# Patient Record
Sex: Male | Born: 1965 | Race: Black or African American | Hispanic: No | Marital: Married | Smoking: Never smoker
Health system: Southern US, Community
[De-identification: ages and names within clinical notes are randomized; demographics above are authoritative.]

## PROBLEM LIST (undated history)

## (undated) DIAGNOSIS — N400 Enlarged prostate without lower urinary tract symptoms: Secondary | ICD-10-CM

## (undated) DIAGNOSIS — I10 Essential (primary) hypertension: Secondary | ICD-10-CM

## (undated) HISTORY — PX: LIPOMA EXCISION: SHX5283

---

## 2018-05-09 ENCOUNTER — Ambulatory Visit (HOSPITAL_COMMUNITY)
Admission: EM | Admit: 2018-05-09 | Discharge: 2018-05-09 | Disposition: A | Discharge status: Home / Self Care | Payer: 59 | Attending: Family Medicine | Admitting: Family Medicine

## 2018-05-09 ENCOUNTER — Encounter (HOSPITAL_COMMUNITY): Payer: Self-pay | Admitting: Emergency Medicine

## 2018-05-09 DIAGNOSIS — Z20818 Contact with and (suspected) exposure to other bacterial communicable diseases: Secondary | ICD-10-CM | POA: Insufficient documentation

## 2018-05-09 HISTORY — DX: Essential (primary) hypertension: I10

## 2018-05-09 HISTORY — DX: Benign prostatic hyperplasia without lower urinary tract symptoms: N40.0

## 2018-05-09 MED ORDER — AMOXICILLIN 875 MG PO TABS: 875.0000 mg | ORAL_TABLET | Freq: Two times a day (BID) | ORAL | 0 refills | Status: DC

## 2018-05-09 MED FILL — AMOXICILLIN 875 MG TABS: 875 | 10 days supply | Qty: 20 | Fill #0

## 2018-05-09 NOTE — Discharge Instructions (Addendum)
Take amoxicillin 2 x a day' May discontinue early if the culture is negative

## 2018-05-09 NOTE — ED Triage Notes (Signed)
Pt states his wife and son have sore throats, son tested positive for strep, and his wife states "his breath smells". So he would like to be checked for strep.

## 2018-05-09 NOTE — ED Provider Notes (Addendum)
Michael Duffy    CSN: [id number] Arrival date & time: 05/09/18  1137     History   Chief Complaint Chief Complaint  Patient presents with  . Sore Throat    HPI Michael Duffy is a 53 y.o. male.   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  His son had a positive throat strep test yesterday and today he does not feel well.  Wife said he had "bad breath" and he has some malaise and scratchy throat.  No fever.  No headache, cough, rhinorrhea.    Past Medical History:  Diagnosis Date  . BPH (benign prostatic hyperplasia)   . Hypertension     There are no active problems to display for this patient.   History reviewed. No pertinent surgical history.     Home Medications    Prior to Admission medications   Medication Sig Start Date End Date Taking? Authorizing Provider  aspirin 325 MG tablet Take 325 mg by mouth daily.   Yes [provider]  amoxicillin (AMOXIL) 875 MG tablet Take 1 tablet (875 mg total) by mouth 2 (two) times daily. 05/09/18   Raylene Everts, MD    Family History Family History  Family history unknown: Yes    Social History Social History   Tobacco Use  . Smoking status: Never Smoker  Substance Use Topics  . Alcohol use: Yes    Frequency: Never  . Drug use: Never     Allergies   Patient has no known allergies.   Review of Systems Review of Systems  Constitutional: Positive for fatigue. Negative for chills and fever.  HENT: Positive for sore throat. Negative for ear pain.   Eyes: Negative for pain and visual disturbance.  Respiratory: Negative for cough and shortness of breath.   Cardiovascular: Negative for chest pain and palpitations.  Gastrointestinal: Negative for abdominal pain and vomiting.  Genitourinary: Negative for dysuria and hematuria.  Musculoskeletal: Negative for arthralgias and back pain.  Skin: Negative for color change and rash.  Neurological: Negative for seizures and  syncope.  All other systems reviewed and are negative.    Physical Exam Triage Vital Signs ED Triage Vitals  Enc Vitals Group     BP 05/09/18 1158 (!) 158/94     Pulse Rate 05/09/18 1156 60     Resp 05/09/18 1156 18     Temp 05/09/18 1158 98.2 F (36.8 C)     Temp src --      SpO2 05/09/18 1156 100 %     Weight --      Height --      Head Circumference --      Peak Flow --      Pain Score 05/09/18 1157 0     Pain Loc --      Pain Edu? --      Excl. in [place]? --    No data found.  Updated Vital Signs BP (!) 158/94   Pulse 60   Temp 98.2 F (36.8 C)   Resp 18   SpO2 100%       Physical Exam Constitutional:      General: He is not in acute distress.    Appearance: He is well-developed and normal weight.  HENT:     Head: Normocephalic and atraumatic.     Right Ear: Tympanic membrane and ear canal normal.     Left Ear: Tympanic membrane and ear canal normal.  Nose: No congestion.     Mouth/Throat:     Pharynx: Posterior oropharyngeal erythema present.     Tonsils: No tonsillar exudate. Swelling: 0 on the right. 0 on the left.  Eyes:     Conjunctiva/sclera: Conjunctivae normal.     Pupils: Pupils are equal, round, and reactive to light.  Neck:     Musculoskeletal: Normal range of motion.  Cardiovascular:     Rate and Rhythm: Normal rate and regular rhythm.     Heart sounds: Normal heart sounds.  Pulmonary:     Effort: Pulmonary effort is normal. No respiratory distress.     Breath sounds: Normal breath sounds.  Abdominal:     General: There is no distension.     Palpations: Abdomen is soft.  Musculoskeletal: Normal range of motion.  Lymphadenopathy:     Cervical: No cervical adenopathy.  Skin:    General: Skin is warm and dry.  Neurological:     Mental Status: He is alert.  Psychiatric:        Mood and Affect: Mood normal.        Behavior: Behavior normal.      UC Treatments / Results  Labs (all labs ordered are listed, but only abnormal  results are displayed) Labs Reviewed  CULTURE, GROUP A STREP [facility])    EKG None  Radiology No results found.  Procedures Procedures (including critical care time)  Medications Ordered in UC Medications - No data to display  Initial Impression / Assessment and Plan / UC Course  I have reviewed the triage vital signs and the nursing notes.  Pertinent labs & imaging results that were available during my care of the patient were reviewed by me and considered in my medical decision making (see chart for details).  Clinical Course as of May 10 1599  Tue May 09, 2018  1502 POCT Rapid Strep [YN]    Clinical Course User Index [YN] Raylene Everts, MD    Even though rapid strep is negative I offered PCN to take until the culture is final. Final Clinical Impressions(s) / UC Diagnoses   Final diagnoses:  Strep throat exposure     Discharge Instructions     Take amoxicillin 2 x a day' May discontinue early if the culture is negative    ED Prescriptions    Medication Sig Dispense Auth. Provider   amoxicillin (AMOXIL) 875 MG tablet Take 1 tablet (875 mg total) by mouth 2 (two) times daily. 20 tablet Raylene Everts, MD     Controlled Substance Prescriptions [place] Controlled Substance Registry consulted? Not Applicable   Raylene Everts, MD 05/09/18 1234    Raylene Everts, MD 05/09/18 [phone]

## 2018-05-11 LAB — CULTURE, GROUP A STREP (THRC)

## 2018-10-02 DIAGNOSIS — K5730 Diverticulosis of large intestine without perforation or abscess without bleeding: Secondary | ICD-10-CM | POA: Diagnosis not present

## 2018-10-02 DIAGNOSIS — R972 Elevated prostate specific antigen [PSA]: Secondary | ICD-10-CM | POA: Diagnosis not present

## 2018-10-02 DIAGNOSIS — N4231 Prostatic intraepithelial neoplasia: Secondary | ICD-10-CM | POA: Diagnosis not present

## 2018-10-02 DIAGNOSIS — N401 Enlarged prostate with lower urinary tract symptoms: Secondary | ICD-10-CM | POA: Diagnosis not present

## 2018-10-02 DIAGNOSIS — N132 Hydronephrosis with renal and ureteral calculous obstruction: Secondary | ICD-10-CM | POA: Diagnosis not present

## 2018-10-02 DIAGNOSIS — R351 Nocturia: Secondary | ICD-10-CM | POA: Diagnosis not present

## 2018-10-02 DIAGNOSIS — N200 Calculus of kidney: Secondary | ICD-10-CM | POA: Diagnosis not present

## 2018-10-02 MED FILL — TAMSULOSIN HCL 0.4 MG CAP: 0.4 | 30 days supply | Qty: 30 | Fill #0

## 2018-10-02 MED FILL — OXYCODONE-ACETAMINOPHEN 5-3: 5-325 | 2 days supply | Qty: 15 | Fill #0

## 2018-10-06 DIAGNOSIS — D510 Vitamin B12 deficiency anemia due to intrinsic factor deficiency: Secondary | ICD-10-CM | POA: Diagnosis not present

## 2018-10-06 DIAGNOSIS — I110 Hypertensive heart disease with heart failure: Secondary | ICD-10-CM | POA: Diagnosis not present

## 2018-10-06 DIAGNOSIS — E559 Vitamin D deficiency, unspecified: Secondary | ICD-10-CM | POA: Diagnosis not present

## 2018-10-06 DIAGNOSIS — R5383 Other fatigue: Secondary | ICD-10-CM | POA: Diagnosis not present

## 2018-10-06 DIAGNOSIS — R972 Elevated prostate specific antigen [PSA]: Secondary | ICD-10-CM | POA: Diagnosis not present

## 2018-10-06 DIAGNOSIS — E7849 Other hyperlipidemia: Secondary | ICD-10-CM | POA: Diagnosis not present

## 2018-10-06 MED FILL — OLMESARTAN MEDOXOMIL 20 MG: 20 | 30 days supply | Qty: 30 | Fill #0

## 2018-10-11 DIAGNOSIS — N201 Calculus of ureter: Secondary | ICD-10-CM | POA: Diagnosis not present

## 2018-10-30 MED FILL — TAMSULOSIN HCL 0.4 MG CAP: 0.4 | 90 days supply | Qty: 90 | Fill #0

## 2018-10-30 MED FILL — OLMESARTAN MEDOXOMIL 20 MG: 20 | 30 days supply | Qty: 30 | Fill #1

## 2018-11-01 DIAGNOSIS — R350 Frequency of micturition: Secondary | ICD-10-CM | POA: Diagnosis not present

## 2018-11-01 DIAGNOSIS — N201 Calculus of ureter: Secondary | ICD-10-CM | POA: Diagnosis not present

## 2018-11-01 DIAGNOSIS — N401 Enlarged prostate with lower urinary tract symptoms: Secondary | ICD-10-CM | POA: Diagnosis not present

## 2018-12-03 MED FILL — OLMESARTAN MEDOXOMIL 20 MG: 20 | 30 days supply | Qty: 30 | Fill #2

## 2018-12-21 ENCOUNTER — Other Ambulatory Visit: Payer: Self-pay | Admitting: Occupational Medicine

## 2018-12-22 LAB — PSA: PSA: 6 ng/mL — ABNORMAL HIGH (ref <=4.0)

## 2018-12-22 LAB — COMPLETE METABOLIC PANEL WITH GFR
AG Ratio: 1.4 (calc) (ref 1.0–2.5)
ALT: 14 U/L (ref 9–46)
AST: 17 U/L (ref 10–35)
Albumin: 4.4 g/dL (ref 3.6–5.1)
Alkaline phosphatase (APISO): 61 U/L (ref 35–144)
BUN: 16 mg/dL (ref 7–25)
CO2: 30 mmol/L (ref 20–32)
Calcium: 9.9 mg/dL (ref 8.6–10.3)
Chloride: 103 mmol/L (ref 98–110)
Creat: 1.2 mg/dL (ref 0.70–1.33)
GFR, Est African American: 81 mL/min/{1.73_m2} (ref >=60)
GFR, Est Non African American: 70 mL/min/{1.73_m2} (ref >=60)
Globulin: 3.1 g/dL (calc) (ref 1.9–3.7)
Glucose, Bld: 91 mg/dL (ref 65–99)
Potassium: 4.3 mmol/L (ref 3.5–5.3)
Sodium: 140 mmol/L (ref 135–146)
Total Bilirubin: 0.7 mg/dL (ref 0.2–1.2)
Total Protein: 7.5 g/dL (ref 6.1–8.1)

## 2018-12-22 LAB — CBC WITH DIFFERENTIAL/PLATELET
Absolute Monocytes: 594 cells/uL (ref 200–950)
Basophils Absolute: 30 cells/uL (ref 0–200)
Basophils Relative: 0.5 %
Eosinophils Absolute: 42 cells/uL (ref 15–500)
Eosinophils Relative: 0.7 %
HCT: 41.1 % (ref 38.5–50.0)
Hemoglobin: 13.9 g/dL (ref 13.2–17.1)
Lymphs Abs: 2610 cells/uL (ref 850–3900)
MCH: 30.1 pg (ref 27.0–33.0)
MCHC: 33.8 g/dL (ref 32.0–36.0)
MCV: 89 fL (ref 80.0–100.0)
MPV: 9.8 fL (ref 7.5–12.5)
Monocytes Relative: 9.9 %
Neutro Abs: 2724 cells/uL (ref 1500–7800)
Neutrophils Relative %: 45.4 %
Platelets: 222 10*3/uL (ref 140–400)
RBC: 4.62 10*6/uL (ref 4.20–5.80)
RDW: 12.1 % (ref 11.0–15.0)
Total Lymphocyte: 43.5 %
WBC: 6 10*3/uL (ref 3.8–10.8)

## 2018-12-22 LAB — LIPID PANEL
Cholesterol: 175 mg/dL (ref <200)
HDL: 55 mg/dL (ref >=40)
LDL Cholesterol (Calc): 103 mg/dL (calc) — ABNORMAL HIGH
Non-HDL Cholesterol (Calc): 120 mg/dL (calc) (ref <130)
Total CHOL/HDL Ratio: 3.2 (calc) (ref <5.0)
Triglycerides: 80 mg/dL (ref <150)

## 2019-01-04 MED FILL — OLMESARTAN MEDOXOMIL 20 MG: 20 | 30 days supply | Qty: 30 | Fill #3

## 2019-01-25 MED FILL — TAMSULOSIN HCL 0.4 MG CAP: 0.4 | 90 days supply | Qty: 90 | Fill #1

## 2019-02-05 MED FILL — OLMESARTAN MEDOXOMIL 20 MG: 20 | 30 days supply | Qty: 30 | Fill #4

## 2019-03-07 MED FILL — OLMESARTAN MEDOXOMIL 20 MG: 20 | 30 days supply | Qty: 30 | Fill #5

## 2019-04-10 MED FILL — OLMESARTAN MEDOXOMIL 20 MG: 20 | 30 days supply | Qty: 30 | Fill #6

## 2019-05-10 MED FILL — TAMSULOSIN HCL 0.4 MG CAP: 0.4 | 90 days supply | Qty: 90 | Fill #2

## 2019-05-10 MED FILL — OLMESARTAN MEDOXOMIL 20 MG: 20 | 30 days supply | Qty: 30 | Fill #7

## 2019-06-15 MED FILL — OLMESARTAN MEDOXOMIL 20 MG: 20 | 30 days supply | Qty: 30 | Fill #8

## 2019-07-17 MED FILL — OLMESARTAN MEDOXOMIL 20 MG: 20 | 30 days supply | Qty: 30 | Fill #9

## 2019-08-22 MED FILL — OLMESARTAN MEDOXOMIL 20 MG: 20 | 30 days supply | Qty: 30 | Fill #10

## 2019-09-07 ENCOUNTER — Telehealth: Payer: Self-pay | Admitting: Internal Medicine

## 2019-09-07 NOTE — Telephone Encounter (Signed)
    Will you accept Mr Germond as a  new patient?

## 2019-09-20 MED FILL — OLMESARTAN MEDOXOMIL 20 MG: 20 | 30 days supply | Qty: 30 | Fill #0

## 2019-10-29 MED FILL — OLMESARTAN MEDOXOMIL 20 MG: 20 | 30 days supply | Qty: 30 | Fill #1

## 2019-11-07 MED FILL — TAMSULOSIN HCL 0.4 MG CAP: 0.4 | 90 days supply | Qty: 90 | Fill #0

## 2019-12-03 MED FILL — OLMESARTAN MEDOXOMIL 20 MG: 20 | 30 days supply | Qty: 30 | Fill #2

## 2019-12-10 ENCOUNTER — Encounter: Payer: Self-pay | Admitting: Family Medicine

## 2019-12-10 ENCOUNTER — Other Ambulatory Visit: Payer: Self-pay | Admitting: *Deleted

## 2019-12-10 ENCOUNTER — Other Ambulatory Visit: Payer: Self-pay

## 2019-12-10 ENCOUNTER — Ambulatory Visit (INDEPENDENT_AMBULATORY_CARE_PROVIDER_SITE_OTHER): Payer: 59 | Admitting: Family Medicine

## 2019-12-10 VITALS — BP 148/78 | HR 64 | Temp 97.4°F | Ht 69.0 in | Wt 234.0 lb

## 2019-12-10 DIAGNOSIS — Z23 Encounter for immunization: Secondary | ICD-10-CM | POA: Diagnosis not present

## 2019-12-10 DIAGNOSIS — N200 Calculus of kidney: Secondary | ICD-10-CM | POA: Diagnosis not present

## 2019-12-10 DIAGNOSIS — I10 Essential (primary) hypertension: Secondary | ICD-10-CM

## 2019-12-10 DIAGNOSIS — Z1211 Encounter for screening for malignant neoplasm of colon: Secondary | ICD-10-CM | POA: Diagnosis not present

## 2019-12-10 DIAGNOSIS — Z13220 Encounter for screening for lipoid disorders: Secondary | ICD-10-CM

## 2019-12-10 DIAGNOSIS — M545 Low back pain, unspecified: Secondary | ICD-10-CM | POA: Insufficient documentation

## 2019-12-10 DIAGNOSIS — Z125 Encounter for screening for malignant neoplasm of prostate: Secondary | ICD-10-CM | POA: Diagnosis not present

## 2019-12-10 DIAGNOSIS — Z131 Encounter for screening for diabetes mellitus: Secondary | ICD-10-CM

## 2019-12-10 DIAGNOSIS — L732 Hidradenitis suppurativa: Secondary | ICD-10-CM | POA: Diagnosis not present

## 2019-12-10 DIAGNOSIS — N400 Enlarged prostate without lower urinary tract symptoms: Secondary | ICD-10-CM

## 2019-12-10 MED ORDER — OLMESARTAN MEDOXOMIL-HCTZ 20-12.5 MG PO TABS: 1.0000 | ORAL_TABLET | Freq: Every day | ORAL | 3 refills | Status: DC

## 2019-12-10 MED ORDER — TAMSULOSIN HCL 0.4 MG PO CAPS: 0.4000 mg | ORAL_CAPSULE | Freq: Every day | ORAL | 3 refills | Status: DC

## 2019-12-10 MED FILL — OLMESARTAN-HCTZ 20-12.5 MG: 20-12.5 | 90 days supply | Qty: 90 | Fill #0

## 2019-12-10 MED FILL — TAMSULOSIN HCL 0.4 MG CAP: 0.4 | 90 days supply | Qty: 90 | Fill #0

## 2019-12-10 NOTE — Assessment & Plan Note (Signed)
Stable.  Refill Flomax 0.4 mg daily.  Check PSA.

## 2019-12-10 NOTE — Assessment & Plan Note (Signed)
Above goal.  Per patient not well controlled.  We will switch olmesartan monotherapy to olmesartan-HCTZ 20-12.5 dual therapy.  Check CBC, CMET, TSH today.  Continue home monitoring goal 140/90 or lower.

## 2019-12-10 NOTE — Assessment & Plan Note (Signed)
No recent recurrences.  Given elevated blood pressure readings today we will start HCTZ 12.5 mg daily which should also help reduce incidence of kidney stones-patient has had calcium oxalate stones in the past.

## 2019-12-10 NOTE — Patient Instructions (Signed)
It was very nice to see you today!  We will change you blood pressure medication to olmesartan-HCTZ.  Please keep an eye on your blood pressure and let me know if persistently 140/90 or higher.  We will give you your flu vaccine today.  We will check blood work today.  Please try using Hibiclens for the hidradenitis.  Let me know if you would like to try Clindagel.  I will place a referral for you to get your colonoscopy.  I will see you back in year for your an annual physical with blood work.  Please come back to see me sooner if needed.  Take care, Dr Jerline Pain  Please try these tips to maintain a healthy lifestyle:   Eat at least 3 REAL meals and 1-2 snacks per day.  Aim for no more than 5 hours between eating.  If you eat breakfast, please do so within one hour of getting up.    Each meal should contain half fruits/vegetables, one quarter protein, and one quarter carbs (no bigger than a computer mouse)   Cut down on sweet beverages. This includes juice, soda, and sweet tea.     Drink at least 1 glass of water with each meal and aim for at least 8 glasses per day   Exercise at least 150 minutes every week.

## 2019-12-10 NOTE — Assessment & Plan Note (Signed)
Recommended over-the-counter Hibiclens wash twice weekly.  Consider Clindagel if not improving.  Patient deferred for today.

## 2019-12-10 NOTE — Progress Notes (Signed)
Michael Duffy is a 54 y.o. male who presents today for an office visit.  Assessment/Plan:  Chronic Problems Addressed Today: Hidradenitis Recommended over-the-counter Hibiclens wash twice weekly.  Consider Clindagel if not improving.  Patient deferred for today.  BPH (benign prostatic hyperplasia) Stable.  Refill Flomax 0.4 mg daily.  Check PSA.  Kidney stones No recent recurrences.  Given elevated blood pressure readings today we will start HCTZ 12.5 mg daily which should also help reduce incidence of kidney stones-patient has had calcium oxalate stones in the past.   Essential hypertension Above goal.  Per patient not well controlled.  We will switch olmesartan monotherapy to olmesartan-HCTZ 20-12.5 dual therapy.  Check CBC, CMET, TSH today.  Continue home monitoring goal 140/90 or lower.  Preventative Healthcare Flu vaccine given today.  Will place referral for colonoscopy.  Check CBC, CMET, TSH, lipid panel, PSA, A1c.    Subjective:  HPI:  Patient is here to establish care.  Would like to talk about blood pressure, BPH, and hidradenitis.  Please see A/P for status of chronic conditions.  PMH:  The following were reviewed and entered/updated in epic: Past Medical History:  Diagnosis Date  . BPH (benign prostatic hyperplasia)   . Hypertension    Patient Active Problem List   Diagnosis Date Noted  . Essential hypertension 12/10/2019  . Kidney stones 12/10/2019  . BPH (benign prostatic hyperplasia) 12/10/2019  . Back pain, lumbosacral 12/10/2019  . Hidradenitis 12/10/2019   Past Surgical History:  Procedure Laterality Date  . LIPOMA EXCISION      Family History  Problem Relation Age of Onset  . Hypertension Mother   . Hypertension Sister     Medications- reviewed and updated Current Outpatient Medications  Medication Sig Dispense Refill  . tamsulosin (FLOMAX) 0.4 MG CAPS capsule Take 1 capsule (0.4 mg total) by mouth daily. 90 capsule 3  . aspirin 325  MG tablet Take 325 mg by mouth daily.    Marland Kitchen olmesartan-hydrochlorothiazide (BENICAR HCT) 20-12.5 MG tablet Take 1 tablet by mouth daily. 90 tablet 3   No current facility-administered medications for this visit.    Allergies-reviewed and updated No Known Allergies  Social History   Socioeconomic History  . Marital status: Married    Spouse name: Not on file  . Number of children: Not on file  . Years of education: Not on file  . Highest education level: Not on file  Occupational History  . Not on file  Tobacco Use  . Smoking status: Never Smoker  Substance and Sexual Activity  . Alcohol use: Yes    Comment: one or twice per year   . Drug use: Never  . Sexual activity: Yes  Other Topics Concern  . Not on file  Social History Narrative  . Not on file   Social Determinants of Health   Financial Resource Strain:   . Difficulty of Paying Living Expenses: Not on file  Food Insecurity:   . Worried About Charity fundraiser in the Last Year: Not on file  . Ran Out of Food in the Last Year: Not on file  Transportation Needs:   . Lack of Transportation (Medical): Not on file  . Lack of Transportation (Non-Medical): Not on file  Physical Activity:   . Days of Exercise per Week: Not on file  . Minutes of Exercise per Session: Not on file  Stress:   . Feeling of Stress : Not on file  Social Connections:   . Frequency  of Communication with Friends and Family: Not on file  . Frequency of Social Gatherings with Friends and Family: Not on file  . Attends Religious Services: Not on file  . Active Member of Clubs or Organizations: Not on file  . Attends Archivist Meetings: Not on file  . Marital Status: Not on file         Objective:  Physical Exam: BP (!) 148/78   Pulse 64   Temp (!) 97.4 F (36.3 C) (Temporal)   Ht 5\' 9"  (1.753 m)   Wt 234 lb (106.1 kg)   SpO2 98%   BMI 34.56 kg/m   Gen: No acute distress, resting comfortably CV: Regular rate and rhythm  with no murmurs appreciated Pulm: Normal work of breathing, clear to auscultation bilaterally with no crackles, wheezes, or rhonchi Skin: Scattered indurated nodules on neck line Neuro: Grossly normal, moves all extremities Psych: Normal affect and thought content      Michael Duffy M. Jerline Pain, MD 12/10/2019 10:19 AM

## 2019-12-11 ENCOUNTER — Encounter: Payer: Self-pay | Admitting: Family Medicine

## 2019-12-11 DIAGNOSIS — R739 Hyperglycemia, unspecified: Secondary | ICD-10-CM | POA: Insufficient documentation

## 2019-12-11 DIAGNOSIS — E785 Hyperlipidemia, unspecified: Secondary | ICD-10-CM | POA: Insufficient documentation

## 2019-12-11 LAB — LIPID PANEL
Cholesterol: 177 mg/dL (ref <200)
HDL: 49 mg/dL (ref >=40)
LDL Cholesterol (Calc): 111 mg/dL (calc) — ABNORMAL HIGH
Non-HDL Cholesterol (Calc): 128 mg/dL (calc) (ref <130)
Total CHOL/HDL Ratio: 3.6 (calc) (ref <5.0)
Triglycerides: 84 mg/dL (ref <150)

## 2019-12-11 LAB — COMPREHENSIVE METABOLIC PANEL
AG Ratio: 1.7 (calc) (ref 1.0–2.5)
ALT: 17 U/L (ref 9–46)
AST: 15 U/L (ref 10–35)
Albumin: 4.3 g/dL (ref 3.6–5.1)
Alkaline phosphatase (APISO): 74 U/L (ref 35–144)
BUN: 16 mg/dL (ref 7–25)
CO2: 28 mmol/L (ref 20–32)
Calcium: 9.3 mg/dL (ref 8.6–10.3)
Chloride: 103 mmol/L (ref 98–110)
Creat: 1.14 mg/dL (ref 0.70–1.33)
Globulin: 2.5 g/dL (calc) (ref 1.9–3.7)
Glucose, Bld: 100 mg/dL — ABNORMAL HIGH (ref 65–99)
Potassium: 4.2 mmol/L (ref 3.5–5.3)
Sodium: 139 mmol/L (ref 135–146)
Total Bilirubin: 0.7 mg/dL (ref 0.2–1.2)
Total Protein: 6.8 g/dL (ref 6.1–8.1)

## 2019-12-11 LAB — CBC
HCT: 41.5 % (ref 38.5–50.0)
Hemoglobin: 13.8 g/dL (ref 13.2–17.1)
MCH: 29.5 pg (ref 27.0–33.0)
MCHC: 33.3 g/dL (ref 32.0–36.0)
MCV: 88.7 fL (ref 80.0–100.0)
MPV: 10 fL (ref 7.5–12.5)
Platelets: 205 10*3/uL (ref 140–400)
RBC: 4.68 10*6/uL (ref 4.20–5.80)
RDW: 12.1 % (ref 11.0–15.0)
WBC: 5.6 10*3/uL (ref 3.8–10.8)

## 2019-12-11 LAB — TSH: TSH: 2.08 mIU/L (ref 0.40–4.50)

## 2019-12-11 LAB — PSA: PSA: 7.81 ng/mL — ABNORMAL HIGH (ref <=4.0)

## 2019-12-11 LAB — HEMOGLOBIN A1C
Hgb A1c MFr Bld: 5.7 % of total Hgb — ABNORMAL HIGH (ref <5.7)
Mean Plasma Glucose: 117 (calc)
eAG (mmol/L): 6.5 (calc)

## 2019-12-11 NOTE — Progress Notes (Signed)
Please inform patient of the following:  Blood sugar and cholesterol are borderline. Do not need to start meds but he should work on diet and exercise and we can recheck in a year.   PSA is elevated - I see where he has schedule appointment with urology.  I think this is a good idea.   Everything else is NORMAL.  Algis Greenhouse. Jerline Pain, MD 12/11/2019 9:57 AM

## 2019-12-27 ENCOUNTER — Encounter: Payer: Self-pay | Admitting: Gastroenterology

## 2020-01-23 DIAGNOSIS — R972 Elevated prostate specific antigen [PSA]: Secondary | ICD-10-CM | POA: Diagnosis not present

## 2020-01-23 DIAGNOSIS — N200 Calculus of kidney: Secondary | ICD-10-CM | POA: Diagnosis not present

## 2020-01-24 ENCOUNTER — Other Ambulatory Visit: Payer: Self-pay | Admitting: Urology

## 2020-01-24 DIAGNOSIS — R972 Elevated prostate specific antigen [PSA]: Secondary | ICD-10-CM

## 2020-02-16 ENCOUNTER — Other Ambulatory Visit: Payer: Self-pay

## 2020-02-16 ENCOUNTER — Ambulatory Visit
Admission: RE | Admit: 2020-02-16 | Discharge: 2020-02-16 | Disposition: A | Discharge status: Home / Self Care | Payer: 59 | Source: Ambulatory Visit | Attending: Urology | Admitting: Urology

## 2020-02-16 DIAGNOSIS — R972 Elevated prostate specific antigen [PSA]: Secondary | ICD-10-CM

## 2020-02-22 ENCOUNTER — Other Ambulatory Visit (HOSPITAL_COMMUNITY): Payer: Self-pay | Admitting: Urology

## 2020-02-22 MED FILL — DIAZEPAM 10 MG TABS: 10 | 1 days supply | Qty: 1 | Fill #0

## 2020-02-25 ENCOUNTER — Ambulatory Visit (AMBULATORY_SURGERY_CENTER): Payer: Self-pay | Admitting: *Deleted

## 2020-02-25 ENCOUNTER — Other Ambulatory Visit: Payer: Self-pay | Admitting: Gastroenterology

## 2020-02-25 ENCOUNTER — Other Ambulatory Visit: Payer: Self-pay | Admitting: Urology

## 2020-02-25 ENCOUNTER — Other Ambulatory Visit: Payer: Self-pay

## 2020-02-25 ENCOUNTER — Encounter: Payer: Self-pay | Admitting: Gastroenterology

## 2020-02-25 VITALS — Ht 69.0 in | Wt 233.0 lb

## 2020-02-25 DIAGNOSIS — Z1211 Encounter for screening for malignant neoplasm of colon: Secondary | ICD-10-CM

## 2020-02-25 MED ORDER — NA SULFATE-K SULFATE-MG SULF 17.5-3.13-1.6 GM/177ML PO SOLN: ORAL | 0 refills | Status: DC

## 2020-02-25 MED FILL — SUPREP BOWEL PREP KIT: 17.5-3.13-1 | 1 days supply | Qty: 354 | Fill #0

## 2020-02-25 NOTE — Progress Notes (Signed)
Patient is here in-person for PV. Patient denies any allergies to eggs or soy. Patient denies any problems with anesthesia/sedation. Patient denies any oxygen use at home. Patient denies taking any diet/weight loss medications or blood thinners. Patient is not being treated for MRSA or C-diff. Patient is aware of our care-partner policy and YTMMI-19 safety protocol. EMMI education assigned to the patient for the procedure, sent to [facility].   COVID-19 vaccines completed on 01/2020 booster, per patient.   Prep Prescription coupon given to the patient.

## 2020-02-26 ENCOUNTER — Other Ambulatory Visit: Payer: Self-pay | Admitting: Urology

## 2020-02-26 DIAGNOSIS — R972 Elevated prostate specific antigen [PSA]: Secondary | ICD-10-CM

## 2020-03-03 MED FILL — OLMESARTAN-HCTZ 20-12.5 MG: 20-12.5 | 90 days supply | Qty: 90 | Fill #1

## 2020-03-10 ENCOUNTER — Encounter: Payer: Self-pay | Admitting: Gastroenterology

## 2020-03-10 ENCOUNTER — Ambulatory Visit (AMBULATORY_SURGERY_CENTER): Payer: 59 | Admitting: Gastroenterology

## 2020-03-10 ENCOUNTER — Other Ambulatory Visit: Payer: Self-pay

## 2020-03-10 VITALS — BP 152/93 | HR 56 | Temp 97.8°F | Resp 18 | Ht 69.0 in | Wt 233.0 lb

## 2020-03-10 DIAGNOSIS — Z1211 Encounter for screening for malignant neoplasm of colon: Secondary | ICD-10-CM | POA: Diagnosis not present

## 2020-03-10 DIAGNOSIS — D122 Benign neoplasm of ascending colon: Secondary | ICD-10-CM | POA: Diagnosis not present

## 2020-03-10 HISTORY — PX: COLONOSCOPY: SHX174

## 2020-03-10 MED ORDER — SODIUM CHLORIDE 0.9 % IV SOLN: 500.0000 mL | Freq: Once | INTRAVENOUS | Status: DC

## 2020-03-10 NOTE — Op Note (Signed)
[facility] Patient Name: Michael Duffy Procedure Date: 03/10/2020 10:30 AM MRN: [id number] Endoscopist: Mallie Mussel L. Loletha Carrow , MD Age: 54 Referring MD:  Date of Birth: [birth year] Gender: Male Account #: [id number] Procedure:                Colonoscopy Indications:              Screening for colorectal malignant neoplasm, This                            is the patient's first colonoscopy Medicines:                Monitored Anesthesia Care Procedure:                Pre-Anesthesia Assessment:                           - Prior to the procedure, a History and Physical                            was performed, and patient medications and                            allergies were reviewed. The patient's tolerance of                            previous anesthesia was also reviewed. The risks                            and benefits of the procedure and the sedation                            options and risks were discussed with the patient.                            All questions were answered, and informed consent                            was obtained. Prior Anticoagulants: The patient has                            taken no previous anticoagulant or antiplatelet                            agents. ASA Grade Assessment: II - A patient with                            mild systemic disease. After reviewing the risks                            and benefits, the patient was deemed in                            satisfactory condition to undergo the procedure.  After obtaining informed consent, the colonoscope                            was passed under direct vision. Throughout the                            procedure, the patient's blood pressure, pulse, and                            oxygen saturations were monitored continuously. The                            Olympus CF-HQ190L [id number]) Colonoscope was                            introduced through the anus  and advanced to the the                            cecum, identified by appendiceal orifice and                            ileocecal valve. The colonoscopy was performed                            without difficulty. The patient tolerated the                            procedure well. The quality of the bowel                            preparation was excellent. The appendiceal orifice                            and rectum were photographed. The bowel preparation                            used was SUPREP. Scope In: 10:46:56 AM Scope Out: 11:01:05 AM Scope Withdrawal Time: 0 hours 11 minutes 57 seconds  Total Procedure Duration: 0 hours 14 minutes 9 seconds  Findings:                 The perianal and digital rectal examinations were                            normal.                           A diminutive polyp was found in the distal                            ascending colon. The polyp was flat. The polyp was                            removed with a cold biopsy forceps. Resection and  retrieval were complete.                           Multiple diverticula were found in the left colon                            and right colon.                           The exam was otherwise without abnormality on                            direct and retroflexion views. Complications:            No immediate complications. Estimated Blood Loss:     Estimated blood loss was minimal. Impression:               - One diminutive polyp in the distal ascending                            colon, removed with a cold biopsy forceps. Resected                            and retrieved.                           - Diverticulosis in the left colon and in the right                            colon.                           - The examination was otherwise normal on direct                            and retroflexion views. Recommendation:           - Patient has a contact number available  for                            emergencies. The signs and symptoms of potential                            delayed complications were discussed with the                            patient. Return to normal activities tomorrow.                            Written discharge instructions were provided to the                            patient.                           - Resume previous diet.                           -  Continue present medications.                           - Await pathology results.                           - Repeat colonoscopy is recommended for                            surveillance. The colonoscopy date will be                            determined after pathology results from today's                            exam become available for review. Vergene Marland L. Loletha Carrow, MD 03/10/2020 11:06:00 AM This report has been signed electronically.

## 2020-03-10 NOTE — Progress Notes (Signed)
Called to room to assist during endoscopic procedure.  Patient ID and intended procedure confirmed with present staff. Received instructions for my participation in the procedure from the performing physician.  

## 2020-03-10 NOTE — Progress Notes (Signed)
pt tolerated well. VSS. awake and to recovery. Report given to RN.  

## 2020-03-10 NOTE — Progress Notes (Signed)
VS-CW  Pt's states no medical or surgical changes since previsit or office visit.  

## 2020-03-10 NOTE — Patient Instructions (Signed)
Discharge instructions given. Handouts on polyps and diverticulosis. Resume previous medications. YOU HAD AN ENDOSCOPIC PROCEDURE TODAY AT THE [facility]:   Refer to the procedure report that was given to you for any specific questions about what was found during the examination.  If the procedure report does not answer your questions, please call your gastroenterologist to clarify.  If you requested that your care partner not be given the details of your procedure findings, then the procedure report has been included in a sealed envelope for you to review at your convenience later.  YOU SHOULD EXPECT: Some feelings of bloating in the abdomen. Passage of more gas than usual.  Walking can help get rid of the air that was put into your GI tract during the procedure and reduce the bloating. If you had a lower endoscopy (such as a colonoscopy or flexible sigmoidoscopy) you may notice spotting of blood in your stool or on the toilet paper. If you underwent a bowel prep for your procedure, you may not have a normal bowel movement for a few days.  Please Note:  You might notice some irritation and congestion in your nose or some drainage.  This is from the oxygen used during your procedure.  There is no need for concern and it should clear up in a day or so.  SYMPTOMS TO REPORT IMMEDIATELY:   Following lower endoscopy (colonoscopy or flexible sigmoidoscopy):  Excessive amounts of blood in the stool  Significant tenderness or worsening of abdominal pains  Swelling of the abdomen that is new, acute  Fever of 100F or higher   For urgent or emergent issues, a gastroenterologist can be reached at any hour by calling [phone]. Do not use MyChart messaging for urgent concerns.    DIET:  We do recommend a small meal at first, but then you may proceed to your regular diet.  Drink plenty of fluids but you should avoid alcoholic beverages for 24 hours.  ACTIVITY:  You should plan to take  it easy for the rest of today and you should NOT DRIVE or use heavy machinery until tomorrow (because of the sedation medicines used during the test).    FOLLOW UP: Our staff will call the number listed on your records 48-72 hours following your procedure to check on you and address any questions or concerns that you may have regarding the information given to you following your procedure. If we do not reach you, we will leave a message.  We will attempt to reach you two times.  During this call, we will ask if you have developed any symptoms of COVID 19. If you develop any symptoms (ie: fever, flu-like symptoms, shortness of breath, cough etc.) before then, please call [phone].  If you test positive for Covid 19 in the 2 weeks post procedure, please call and report this information to us.    If any biopsies were taken you will be contacted by phone or by letter within the next 1-3 weeks.  Please call us at [phone] if you have not heard about the biopsies in 3 weeks.    SIGNATURES/CONFIDENTIALITY: You and/or your care partner have signed paperwork which will be entered into your electronic medical record.  These signatures attest to the fact that that the information above on your After Visit Summary has been reviewed and is understood.  Full responsibility of the confidentiality of this discharge information lies with you and/or your care-partner. 

## 2020-03-13 ENCOUNTER — Telehealth: Payer: Self-pay

## 2020-03-13 NOTE — Telephone Encounter (Signed)
LVM

## 2020-03-19 MED FILL — TAMSULOSIN HCL 0.4 MG CAP: 0.4 | 90 days supply | Qty: 90 | Fill #1

## 2020-03-24 ENCOUNTER — Encounter: Payer: Self-pay | Admitting: Gastroenterology

## 2020-03-25 ENCOUNTER — Other Ambulatory Visit: Payer: Self-pay

## 2020-03-25 ENCOUNTER — Ambulatory Visit
Admission: RE | Admit: 2020-03-25 | Discharge: 2020-03-25 | Disposition: A | Discharge status: Home / Self Care | Payer: 59 | Source: Ambulatory Visit | Attending: Urology | Admitting: Urology

## 2020-03-25 ENCOUNTER — Other Ambulatory Visit: Payer: 59

## 2020-03-25 DIAGNOSIS — R972 Elevated prostate specific antigen [PSA]: Secondary | ICD-10-CM | POA: Diagnosis not present

## 2020-03-25 IMAGING — MR MR PROSTATE WO/W CM
12 series · 48 of 48 positions shown · IV contrast (multihance)
Comparison: None

CLINICAL DATA: 54-year-old male with elevated PSA 7.8. Prior
negative biopsies most recently [DATE].

EXAM:
MR PROSTATE WITHOUT AND WITH CONTRAST
TECHNIQUE: Multiplanar multisequence MRI images were obtained of the pelvis
centered about the prostate. Pre and post contrast images were
obtained.
CONTRAST:  20mL MULTIHANCE GADOBENATE DIMEGLUMINE 529 MG/ML IV SOLN

[Series 3: T2 · coronal · 3.0mm · 0.56mm/px · 1 of 27 slices shown (1 of 3)]
[im 1/27]
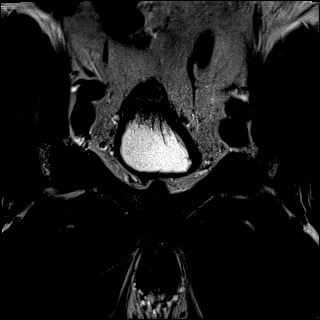

[Series 4: T1 · axial · 5.0mm · 1.25mm/px · 1 of 80 slices shown]
[im 1/80]
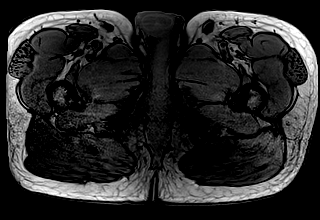

[Series 5: DWI · axial · 3.0mm · 1.75mm/px · z∈[-62,+1]mm · 2 of 66 slices shown (1 of 3)]
[im 1/66]
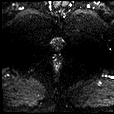
[im 66/66]
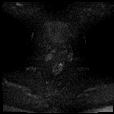

[Series 6: DWI · axial · 3.0mm · 1.75mm/px · 1 of 22 slices shown (2 of 3)]
[im 1/22]
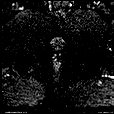

[Series 7: DWI · axial · 3.0mm · 1.75mm/px · 1 of 22 slices shown (3 of 3)]
[im 1/22]
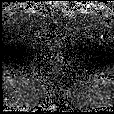

[Series 8: T2 · axial · 3.0mm · 0.56mm/px · 1 of 23 slices shown (2 of 3)]
[im 1/23]
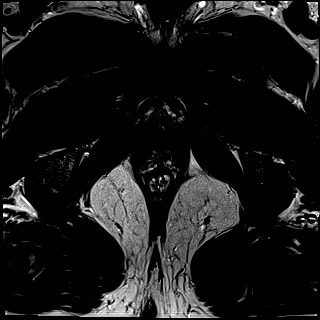

[Series 9: T2 · axial · 1.0mm · 1.04mm/px · z∈[-72,+7]mm · 2 of 80 slices shown (3 of 3)]
[im 1/80]
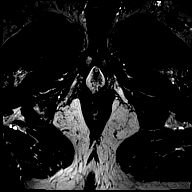
[im 80/80]
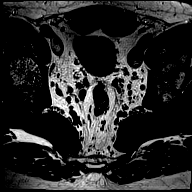

[Series 10: pre t1_twist_tra_dyn · axial · non-contrast · 3.5mm · 0.83mm/px · 1 of 20 slices shown]
[im 1/20]
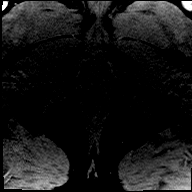

[Series 11: post t1_twist_tra_dyn-copy center · axial · non-contrast · 3.5mm · 0.83mm/px · z∈[-66,+0]mm · 17 of 600 slices shown]
[im 1/600]
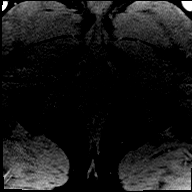
[im 38/600]
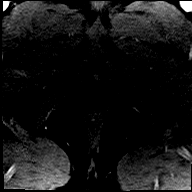
[im 75/600]
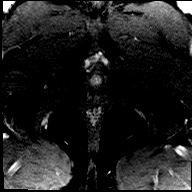
[im 113/600]
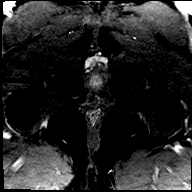
[im 150/600]
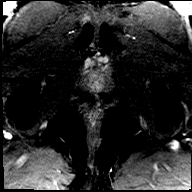
[im 188/600]
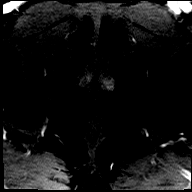
[im 225/600]
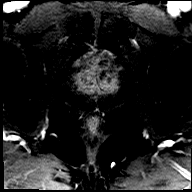
[im 263/600]
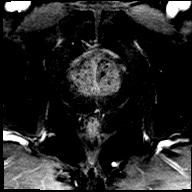
[im 300/600]
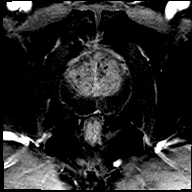
[im 337/600]
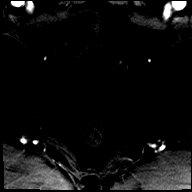
[im 375/600]
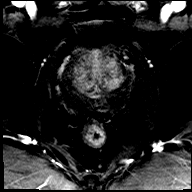
[im 412/600]
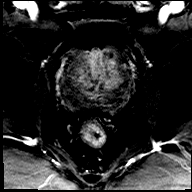
[im 450/600]
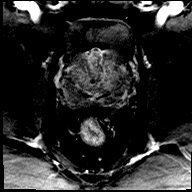
[im 487/600]
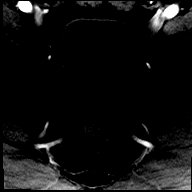
[im 525/600]
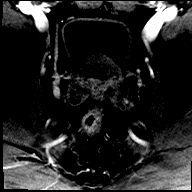
[im 562/600]
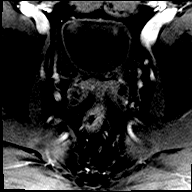
[im 600/600]
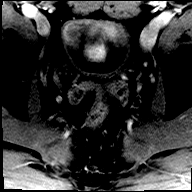

[Series 12: post t1_twist_tra_dyn-copy cent_sub · axial · 3.5mm · 0.83mm/px · z∈[-66,+0]mm · 17 of 580 slices shown]
[im 1/580]
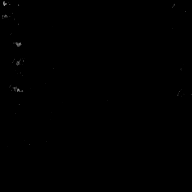
[im 37/580]
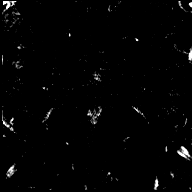
[im 73/580]
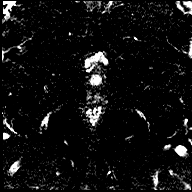
[im 109/580]
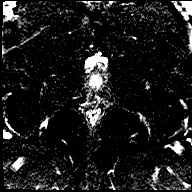
[im 145/580]
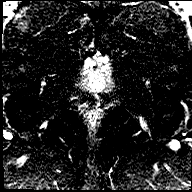
[im 181/580]
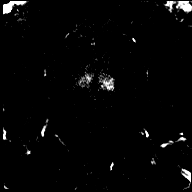
[im 218/580]
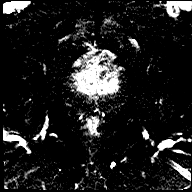
[im 254/580]
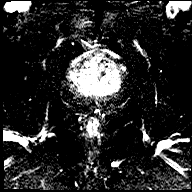
[im 290/580]
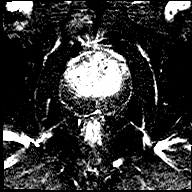
[im 326/580]
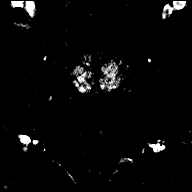
[im 362/580]
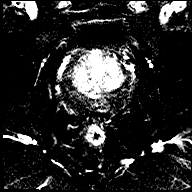
[im 399/580]
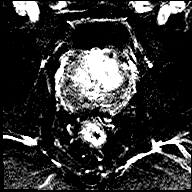
[im 435/580]
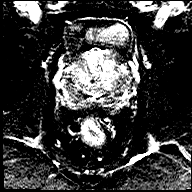
[im 471/580]
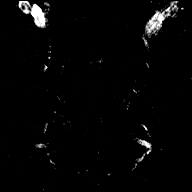
[im 507/580]
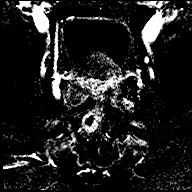
[im 543/580]
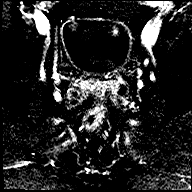
[im 580/580]
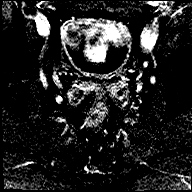

[Series 13: t1_vibe_dixon_tra_f · axial · 2.5mm · 0.91mm/px · z∈[-82,+115]mm · 2 of 80 slices shown]
[im 1/80]
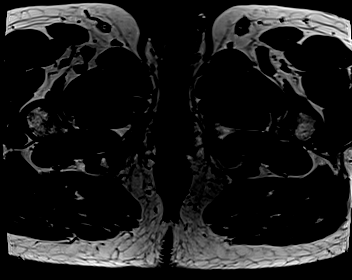
[im 80/80]
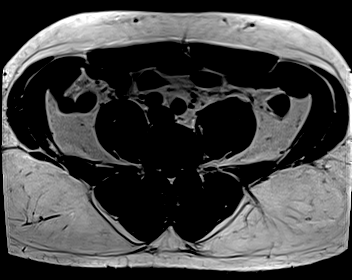

[Series 14: t1_vibe_dixon_tra_w · axial · 2.5mm · 0.91mm/px · z∈[-82,+115]mm · 2 of 80 slices shown]
[im 1/80]
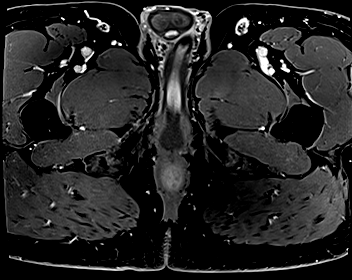
[im 80/80]
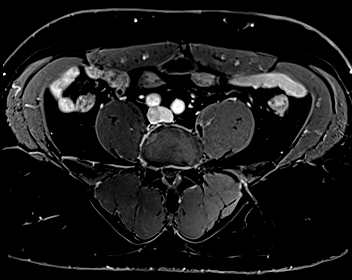

[48 of 48 positions shown; findings below may reference images not displayed]

FINDINGS: Prostate: No foci of restricted diffusion in the peripheral zone
(series 6). Normal high signal intensity within the peripheral zone
on T2 weighted imaging without focal lesion (image series 8).

The transitional zone is enlarged by well capsulated nodules. No
suspicious imaging findings.

No abnormal enhancement pattern.

Prostatic capsule intact.  Seminal vesicles normal.

Volume: 6.1 x 5.3 by 4.2 cm (volume = 71 cm^3)

Transcapsular spread:  Absent

Seminal vesicle involvement: Absent

Neurovascular bundle involvement: Absent

Pelvic adenopathy: Absent

Bone metastasis: Absent

Other findings: None
IMPRESSION: 1. No high-grade carcinoma in the peripheral zone.  PI-RADS: 1.
2. Enlarged nodular transitional zone most consistent benign
prostate hypertrophy. PI-RADS: 2

## 2020-03-25 MED ORDER — GADOBENATE DIMEGLUMINE 529 MG/ML IV SOLN
20.0000 mL | Freq: Once | INTRAVENOUS | Status: AC | PRN
  Administered 2020-03-25: 20 mL via INTRAVENOUS

## 2020-04-12 ENCOUNTER — Other Ambulatory Visit: Payer: 59

## 2020-06-09 MED FILL — OLMESARTAN-HCTZ 20-12.5 MG: 20-12.5 | 90 days supply | Qty: 90 | Fill #2

## 2020-06-18 ENCOUNTER — Other Ambulatory Visit (HOSPITAL_COMMUNITY): Payer: Self-pay | Admitting: Dermatology

## 2020-06-18 DIAGNOSIS — L700 Acne vulgaris: Secondary | ICD-10-CM | POA: Diagnosis not present

## 2020-06-18 MED FILL — CLINDAMYCIN PHOS-BENZOYL PE: 1-5 | 50 days supply | Qty: 25 | Fill #0

## 2020-06-18 MED FILL — DOXYCYCLINE HYCLATE 100 MG: 100 | 30 days supply | Qty: 60 | Fill #0

## 2020-07-17 ENCOUNTER — Other Ambulatory Visit (HOSPITAL_COMMUNITY): Payer: Self-pay

## 2020-07-17 MED FILL — Doxycycline Hyclate Cap 100 MG: ORAL | 30 days supply | Qty: 60 | Fill #0 | Status: AC

## 2020-07-17 MED FILL — Clindamycin Phosphate-Benzoyl Peroxide Gel 1-5%: CUTANEOUS | 30 days supply | Qty: 50 | Fill #0 | Status: CN

## 2020-07-18 ENCOUNTER — Other Ambulatory Visit (HOSPITAL_COMMUNITY): Payer: Self-pay

## 2020-07-18 MED FILL — Clindamycin Phosphate-Benzoyl Peroxide Gel 1-5%: CUTANEOUS | 30 days supply | Qty: 50 | Fill #0 | Status: AC

## 2020-07-28 ENCOUNTER — Other Ambulatory Visit (HOSPITAL_COMMUNITY): Payer: Self-pay

## 2020-08-16 MED FILL — Doxycycline Hyclate Cap 100 MG: ORAL | 30 days supply | Qty: 60 | Fill #1 | Status: AC

## 2020-08-19 ENCOUNTER — Other Ambulatory Visit (HOSPITAL_COMMUNITY): Payer: Self-pay

## 2020-08-19 MED FILL — Tamsulosin HCl Cap 0.4 MG: ORAL | 90 days supply | Qty: 90 | Fill #0 | Status: AC

## 2020-09-13 ENCOUNTER — Other Ambulatory Visit (HOSPITAL_COMMUNITY): Payer: Self-pay

## 2020-09-13 MED FILL — Olmesartan Medoxomil-Hydrochlorothiazide Tab 20-12.5 MG: ORAL | 90 days supply | Qty: 90 | Fill #0 | Status: AC

## 2020-09-18 DIAGNOSIS — L700 Acne vulgaris: Secondary | ICD-10-CM | POA: Diagnosis not present

## 2020-09-19 DIAGNOSIS — R972 Elevated prostate specific antigen [PSA]: Secondary | ICD-10-CM | POA: Diagnosis not present

## 2020-09-22 MED FILL — Doxycycline Hyclate Cap 100 MG: ORAL | 30 days supply | Qty: 60 | Fill #2 | Status: AC

## 2020-09-23 ENCOUNTER — Other Ambulatory Visit (HOSPITAL_COMMUNITY): Payer: Self-pay

## 2020-09-26 ENCOUNTER — Other Ambulatory Visit (HOSPITAL_COMMUNITY): Payer: Self-pay

## 2020-09-26 DIAGNOSIS — R351 Nocturia: Secondary | ICD-10-CM | POA: Diagnosis not present

## 2020-09-26 DIAGNOSIS — N401 Enlarged prostate with lower urinary tract symptoms: Secondary | ICD-10-CM | POA: Diagnosis not present

## 2020-09-26 DIAGNOSIS — R972 Elevated prostate specific antigen [PSA]: Secondary | ICD-10-CM | POA: Diagnosis not present

## 2020-09-26 DIAGNOSIS — Z87442 Personal history of urinary calculi: Secondary | ICD-10-CM | POA: Diagnosis not present

## 2020-09-26 MED ORDER — ALFUZOSIN HCL ER 10 MG PO TB24
ORAL_TABLET | ORAL | 3 refills | Status: DC
  Filled 2020-09-26: qty 90, 90d supply, fill #0
  Filled 2020-12-21: qty 90, 90d supply, fill #1

## 2020-10-22 ENCOUNTER — Other Ambulatory Visit (HOSPITAL_COMMUNITY): Payer: Self-pay

## 2020-10-22 MED FILL — Doxycycline Hyclate Cap 100 MG: ORAL | 30 days supply | Qty: 60 | Fill #3 | Status: AC

## 2020-10-23 ENCOUNTER — Other Ambulatory Visit (HOSPITAL_COMMUNITY): Payer: Self-pay

## 2020-11-21 ENCOUNTER — Other Ambulatory Visit (HOSPITAL_COMMUNITY): Payer: Self-pay

## 2020-11-21 MED FILL — Doxycycline Hyclate Cap 100 MG: ORAL | 30 days supply | Qty: 60 | Fill #4 | Status: AC

## 2020-11-28 DIAGNOSIS — N401 Enlarged prostate with lower urinary tract symptoms: Secondary | ICD-10-CM | POA: Diagnosis not present

## 2020-11-28 DIAGNOSIS — R351 Nocturia: Secondary | ICD-10-CM | POA: Diagnosis not present

## 2020-12-05 ENCOUNTER — Other Ambulatory Visit (HOSPITAL_COMMUNITY): Payer: Self-pay

## 2020-12-05 MED ORDER — LEVOFLOXACIN 750 MG PO TABS
ORAL_TABLET | ORAL | 0 refills | Status: DC
  Filled 2020-12-05: qty 1, 1d supply, fill #0

## 2020-12-09 ENCOUNTER — Other Ambulatory Visit (HOSPITAL_COMMUNITY): Payer: Self-pay

## 2020-12-09 MED ORDER — LEVOFLOXACIN 750 MG PO TABS
ORAL_TABLET | ORAL | 0 refills | Status: DC
  Filled 2020-12-09: qty 1, 1d supply, fill #0

## 2020-12-10 ENCOUNTER — Encounter: Payer: Self-pay | Admitting: Family Medicine

## 2020-12-10 ENCOUNTER — Ambulatory Visit (INDEPENDENT_AMBULATORY_CARE_PROVIDER_SITE_OTHER): Payer: 59 | Admitting: Family Medicine

## 2020-12-10 ENCOUNTER — Other Ambulatory Visit (HOSPITAL_COMMUNITY): Payer: Self-pay

## 2020-12-10 ENCOUNTER — Ambulatory Visit (INDEPENDENT_AMBULATORY_CARE_PROVIDER_SITE_OTHER): Payer: 59

## 2020-12-10 ENCOUNTER — Other Ambulatory Visit: Payer: Self-pay

## 2020-12-10 VITALS — BP 116/66 | HR 66 | Temp 98.1°F | Ht 69.0 in | Wt 229.6 lb

## 2020-12-10 DIAGNOSIS — N400 Enlarged prostate without lower urinary tract symptoms: Secondary | ICD-10-CM

## 2020-12-10 DIAGNOSIS — Z0001 Encounter for general adult medical examination with abnormal findings: Secondary | ICD-10-CM | POA: Diagnosis not present

## 2020-12-10 DIAGNOSIS — N200 Calculus of kidney: Secondary | ICD-10-CM

## 2020-12-10 DIAGNOSIS — Z6833 Body mass index (BMI) 33.0-33.9, adult: Secondary | ICD-10-CM | POA: Diagnosis not present

## 2020-12-10 DIAGNOSIS — R739 Hyperglycemia, unspecified: Secondary | ICD-10-CM | POA: Diagnosis not present

## 2020-12-10 DIAGNOSIS — Z23 Encounter for immunization: Secondary | ICD-10-CM | POA: Diagnosis not present

## 2020-12-10 DIAGNOSIS — M545 Low back pain, unspecified: Secondary | ICD-10-CM

## 2020-12-10 DIAGNOSIS — I10 Essential (primary) hypertension: Secondary | ICD-10-CM

## 2020-12-10 DIAGNOSIS — E785 Hyperlipidemia, unspecified: Secondary | ICD-10-CM | POA: Diagnosis not present

## 2020-12-10 LAB — CBC
HCT: 44.6 % (ref 39.0–52.0)
Hemoglobin: 14.9 g/dL (ref 13.0–17.0)
MCHC: 33.3 g/dL (ref 30.0–36.0)
MCV: 90.7 fl (ref 78.0–100.0)
Platelets: 199 10*3/uL (ref 150.0–400.0)
RBC: 4.91 Mil/uL (ref 4.22–5.81)
RDW: 13.3 % (ref 11.5–15.5)
WBC: 4.5 10*3/uL (ref 4.0–10.5)

## 2020-12-10 LAB — COMPREHENSIVE METABOLIC PANEL
ALT: 23 U/L (ref 0–53)
AST: 23 U/L (ref 0–37)
Albumin: 4.4 g/dL (ref 3.5–5.2)
Alkaline Phosphatase: 59 U/L (ref 39–117)
BUN: 24 mg/dL — ABNORMAL HIGH (ref 6–23)
CO2: 29 mEq/L (ref 19–32)
Calcium: 10 mg/dL (ref 8.4–10.5)
Chloride: 101 mEq/L (ref 96–112)
Creatinine, Ser: 1.39 mg/dL (ref 0.40–1.50)
GFR: 57.71 mL/min — ABNORMAL LOW (ref >60.00)
Glucose, Bld: 95 mg/dL (ref 70–99)
Potassium: 4.2 mEq/L (ref 3.5–5.1)
Sodium: 138 mEq/L (ref 135–145)
Total Bilirubin: 1.1 mg/dL (ref 0.2–1.2)
Total Protein: 7.3 g/dL (ref 6.0–8.3)

## 2020-12-10 LAB — LIPID PANEL
Cholesterol: 185 mg/dL (ref 0–200)
HDL: 57 mg/dL (ref >39.00)
LDL Cholesterol: 107 mg/dL — ABNORMAL HIGH (ref 0–99)
NonHDL: 128.09
Total CHOL/HDL Ratio: 3
Triglycerides: 104 mg/dL (ref 0.0–149.0)
VLDL: 20.8 mg/dL (ref 0.0–40.0)

## 2020-12-10 LAB — HEMOGLOBIN A1C: Hgb A1c MFr Bld: 5.6 % (ref 4.6–6.5)

## 2020-12-10 LAB — TESTOSTERONE: Testosterone: 361.11 ng/dL (ref 300.00–890.00)

## 2020-12-10 LAB — TSH: TSH: 1.91 u[IU]/mL (ref 0.35–5.50)

## 2020-12-10 IMAGING — DX DG LUMBAR SPINE COMPLETE 4+V
5 series · 5 of 5 positions shown · non-contrast
Comparison: None.

CLINICAL DATA: Low back pain.

EXAM:
LUMBAR SPINE - COMPLETE 4+ VIEW

[l-spine ap]
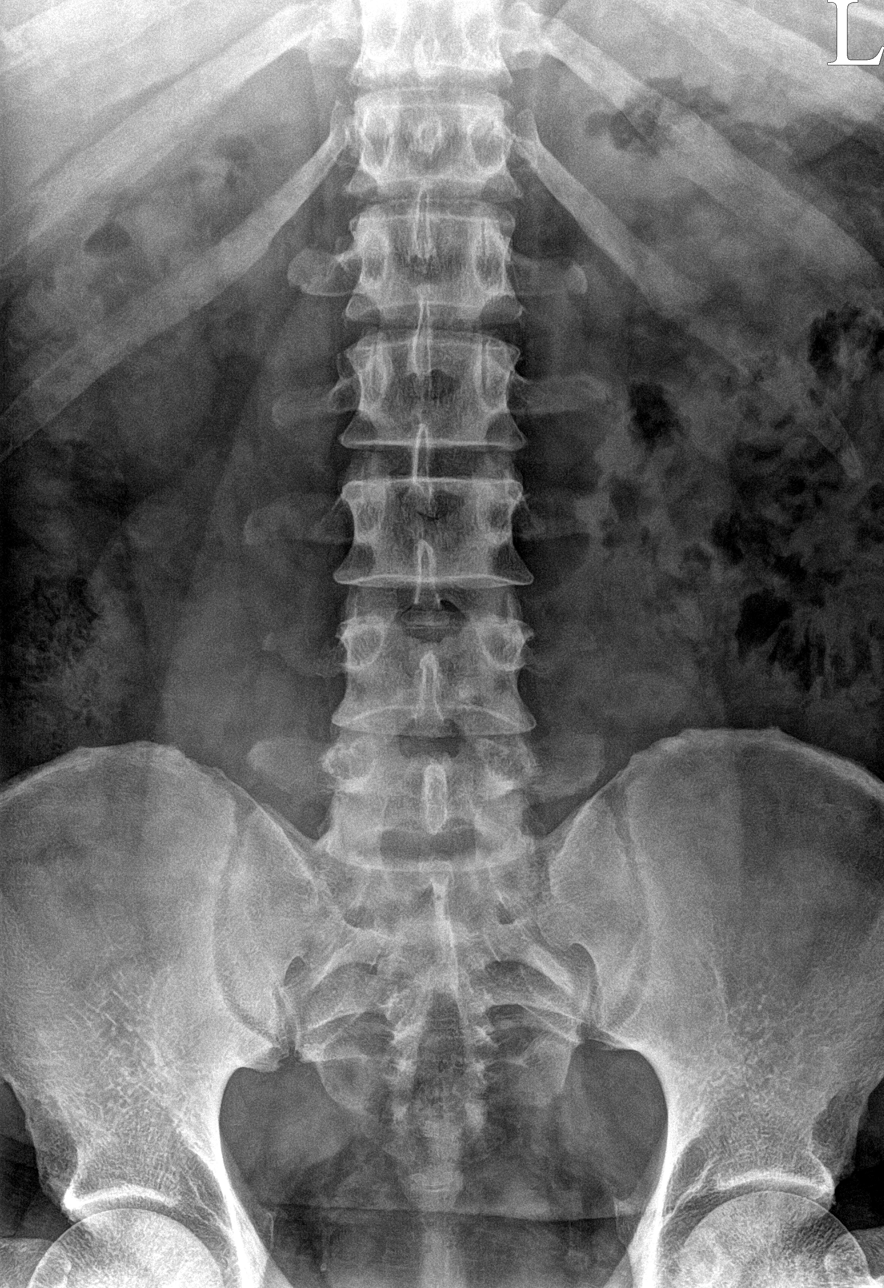

[l-spine obl (1 of 2)]
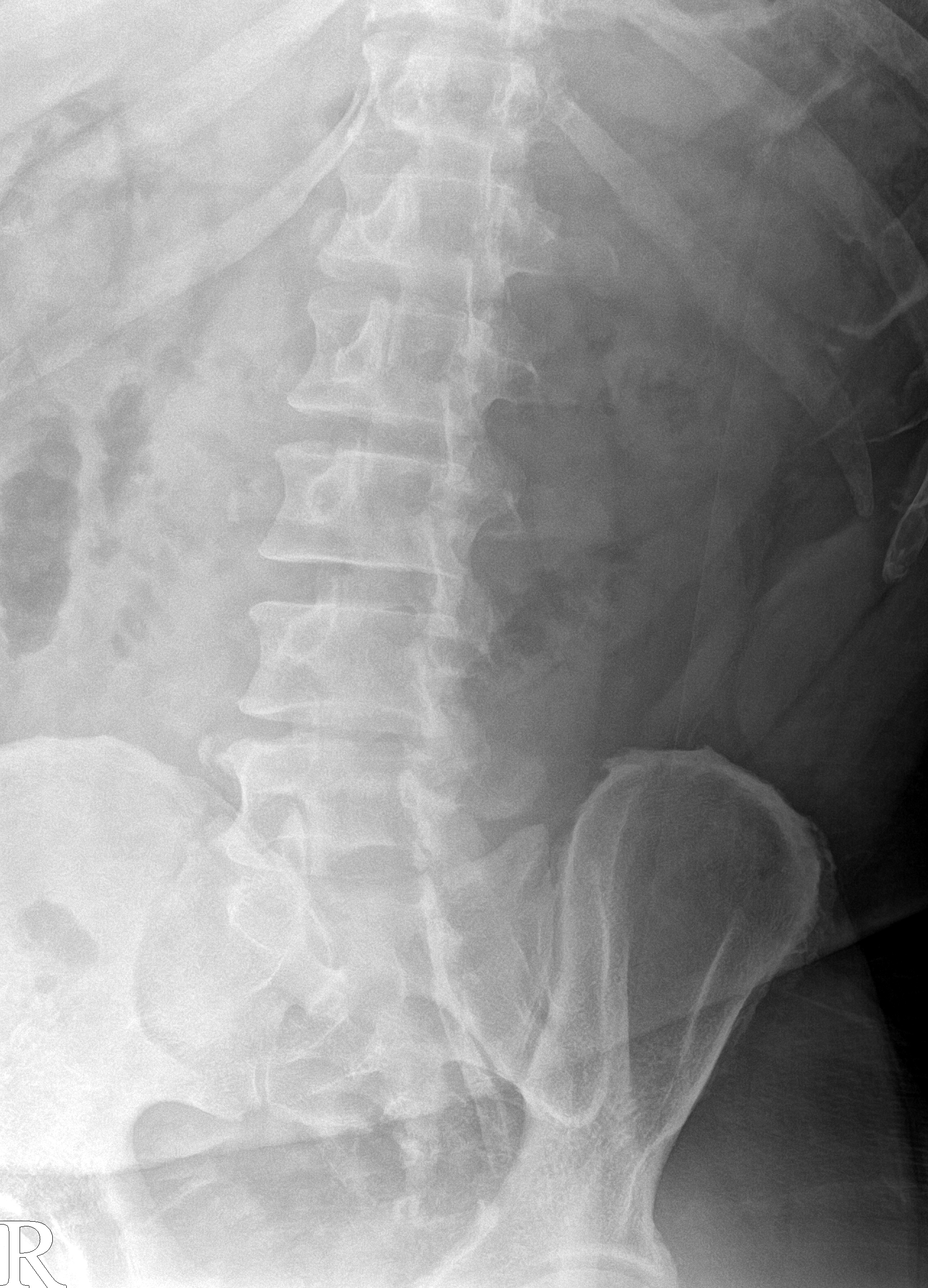

[l-spine obl (2 of 2)]
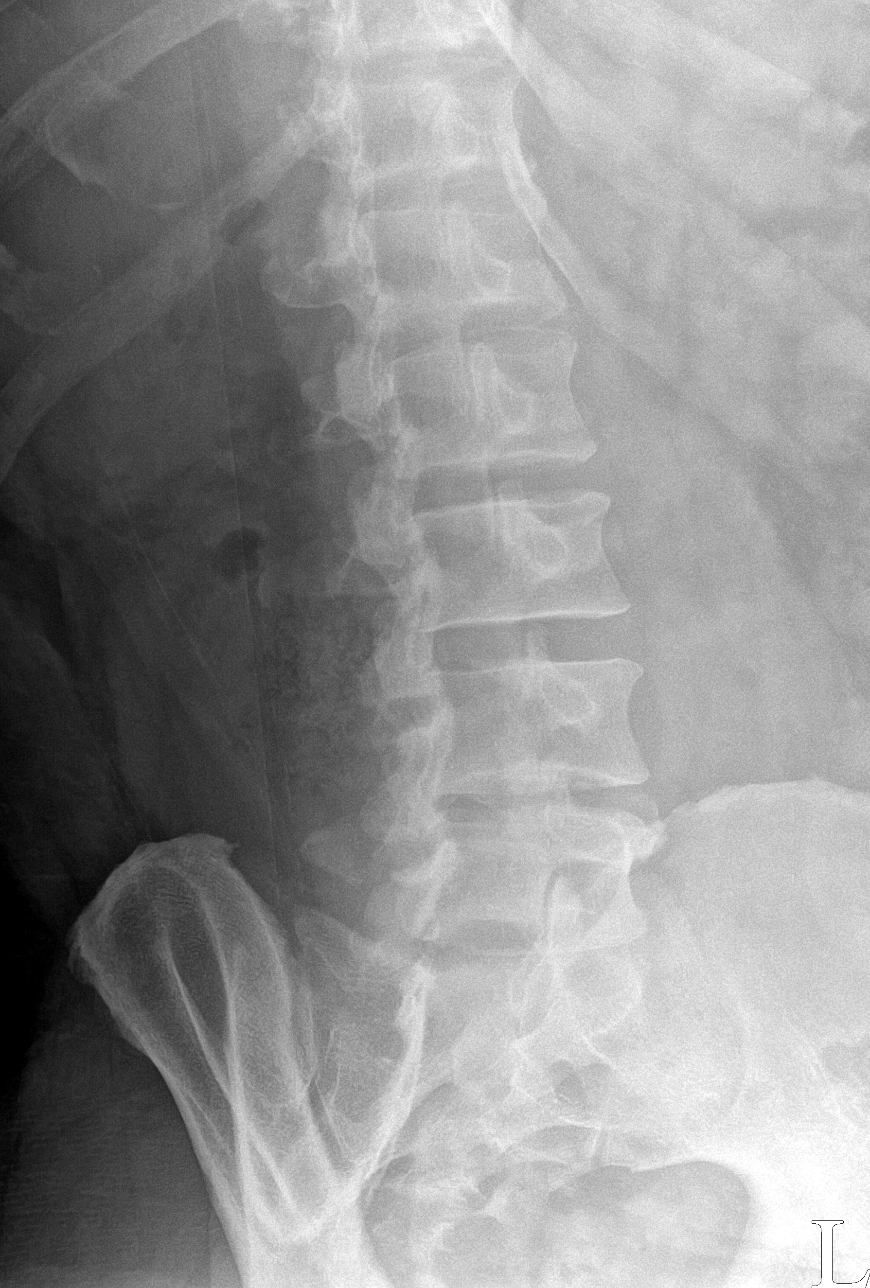

[l-spine lateral]
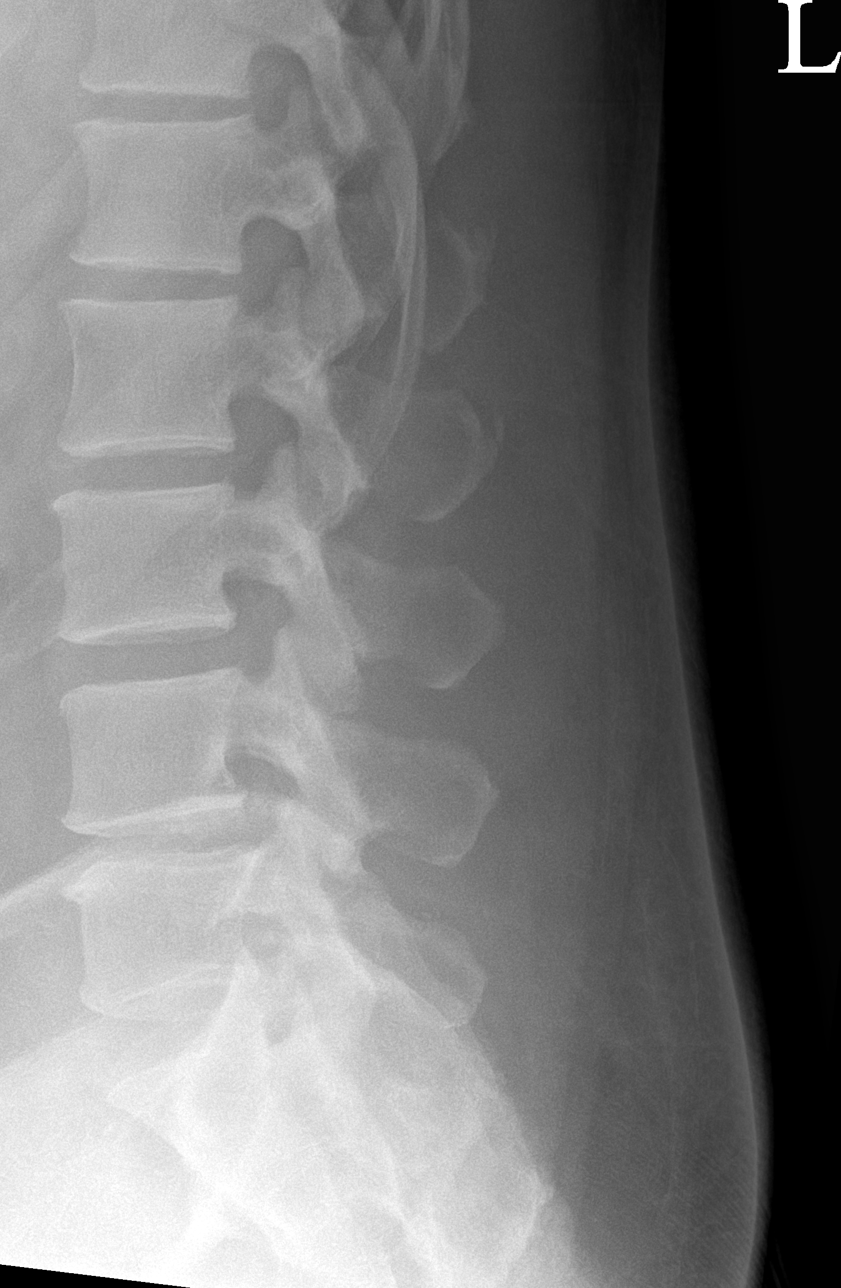

[l-spine spot]
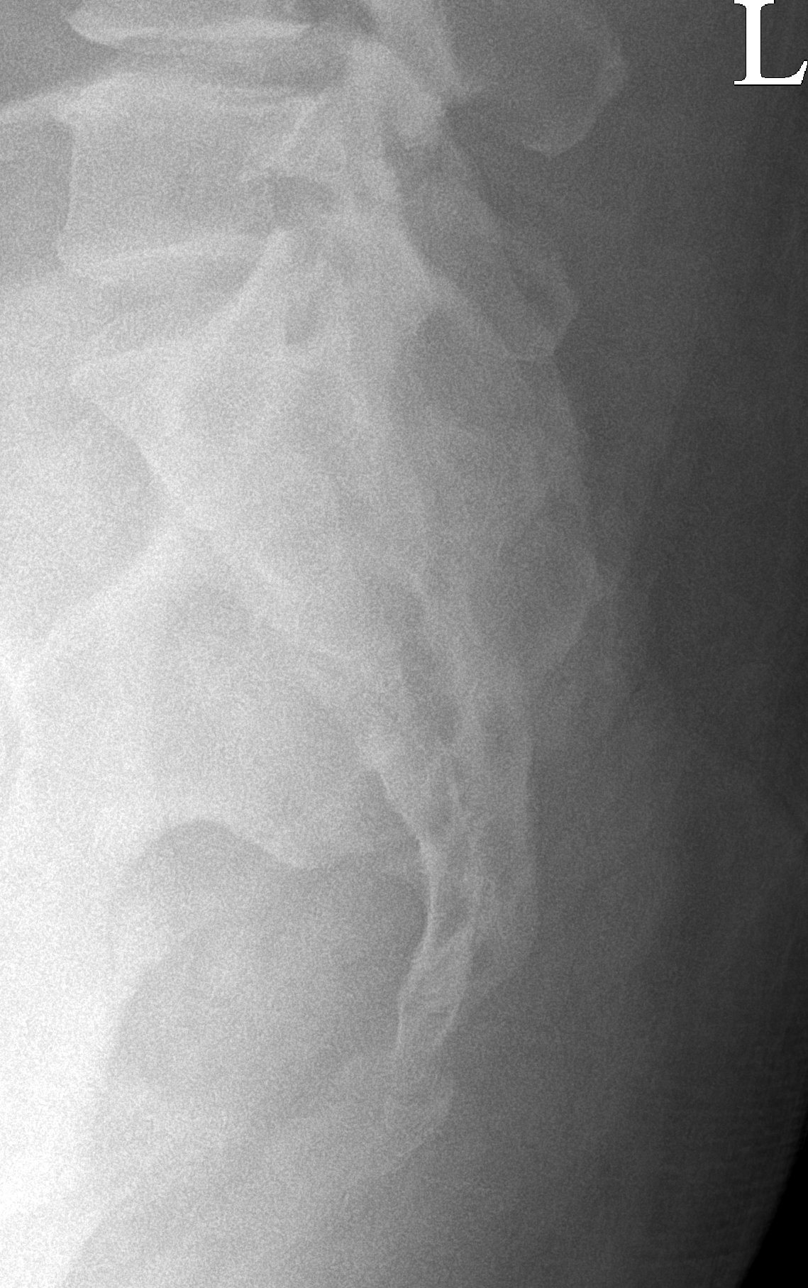

[5 of 5 positions shown; findings below may reference images not displayed]

FINDINGS: Five lumbar type vertebra. There is no acute fracture or subluxation
of the lumbar spine. There is straightening of normal lumbar
lordosis. Mild degenerative changes with disc space narrowing at
L4-L5. The visualized posterior elements are intact. The soft
tissues are unremarkable.
IMPRESSION: No acute/traumatic lumbar spine pathology.

## 2020-12-10 MED ORDER — OLMESARTAN MEDOXOMIL-HCTZ 40-12.5 MG PO TABS
1.0000 | ORAL_TABLET | Freq: Every day | ORAL | 3 refills | Status: DC
  Filled 2020-12-10: qty 90, 90d supply, fill #0
  Filled 2021-03-25: qty 90, 90d supply, fill #1
  Filled 2021-06-26: qty 90, 90d supply, fill #2
  Filled 2021-08-23: qty 90, 90d supply, fill #3

## 2020-12-10 NOTE — Assessment & Plan Note (Signed)
At goal.  He has been taking olmesartan 40 mg daily and HCTZ 12.5 mg daily.  Check labs today.  Refill sent in for combination olmesartan-HCTZ 40-12.5 daily.

## 2020-12-10 NOTE — Assessment & Plan Note (Signed)
Check lipids 

## 2020-12-10 NOTE — Assessment & Plan Note (Signed)
Check A1c. 

## 2020-12-10 NOTE — Assessment & Plan Note (Signed)
Follows with urology.  PSA has been elevated.  Will be undergoing prostate biopsy soon.  He is on alfuzosin 10 mg daily.

## 2020-12-10 NOTE — Addendum Note (Signed)
Addended by: Betti Cruz on: 12/10/2020 10:02 AM   Modules accepted: Orders

## 2020-12-10 NOTE — Patient Instructions (Signed)
It was very nice to see you today!  We will give your shingles vaccine today.  We will get an x-ray of your back.  We will probably get an MRI to take a closer look depending on the results of the x-ray.  Please let me know if you would like to have a prescription NSAID or muscle relaxer sent in.  We will check blood work today.  I will see back in year for your next physical.  Come back to see me sooner if needed.  Take care, Dr Jerline Pain  PLEASE NOTE:  If you had any lab tests please let us know if you have not heard back within a few days. You may see your results on mychart before we have a chance to review them but we will give you a call once they are reviewed by [place]. If we ordered any referrals today, please let us know if you have not heard from their office within the next week.   Please try these tips to maintain a healthy lifestyle:  Eat at least 3 REAL meals and 1-2 snacks per day.  Aim for no more than 5 hours between eating.  If you eat breakfast, please do so within one hour of getting up.   Each meal should contain half fruits/vegetables, one quarter protein, and one quarter carbs (no bigger than a computer mouse)  Cut down on sweet beverages. This includes juice, soda, and sweet tea.   Drink at least 1 glass of water with each meal and aim for at least 8 glasses per day  Exercise at least 150 minutes every week.    Preventive Care 54-46 Years Old, Male Preventive care refers to lifestyle choices and visits with your health care provider that can promote health and wellness. This includes: A yearly physical exam. This is also called an annual wellness visit. Regular dental and eye exams. Immunizations. Screening for certain conditions. Healthy lifestyle choices, such as: Eating a healthy diet. Getting regular exercise. Not using drugs or products that contain nicotine and tobacco. Limiting alcohol use. What can I expect for my preventive care visit? Physical  exam Your health care provider will check your: Height and weight. These may be used to calculate your BMI (body mass index). BMI is a measurement that tells if you are at a healthy weight. Heart rate and blood pressure. Body temperature. Skin for abnormal spots. Counseling Your health care provider may ask you questions about your: Past medical problems. Family's medical history. Alcohol, tobacco, and drug use. Emotional well-being. Home life and relationship well-being. Sexual activity. Diet, exercise, and sleep habits. Work and work Statistician. Access to firearms. What immunizations do I need? Vaccines are usually given at various ages, according to a schedule. Your health care provider will recommend vaccines for you based on your age, medical history, and lifestyle or other factors, such as travel or where you work. What tests do I need? Blood tests Lipid and cholesterol levels. These may be checked every 5 years, or more often if you are over 24 years old. Hepatitis C test. Hepatitis B test. Screening Lung cancer screening. You may have this screening every year starting at age 61 if you have a 30-pack-year history of smoking and currently smoke or have quit within the past 15 years. Prostate cancer screening. Recommendations will vary depending on your family history and other risks. Genital exam to check for testicular cancer or hernias. Colorectal cancer screening. All adults should have this screening starting  at age 69 and continuing until age 39. Your health care provider may recommend screening at age 30 if you are at increased risk. You will have tests every 1-10 years, depending on your results and the type of screening test. Diabetes screening. This is done by checking your blood sugar (glucose) after you have not eaten for a while (fasting). You may have this done every 1-3 years. STD (sexually transmitted disease) testing, if you are at risk. Follow these  instructions at home: Eating and drinking  Eat a diet that includes fresh fruits and vegetables, whole grains, lean protein, and low-fat dairy products. Take vitamin and mineral supplements as recommended by your health care provider. Do not drink alcohol if your health care provider tells you not to drink. If you drink alcohol: Limit how much you have to 0-2 drinks a day. Be aware of how much alcohol is in your drink. In the [place] one drink equals one 12 oz bottle of beer (355 mL), one 5 oz glass of wine (148 mL), or one 1 oz glass of hard liquor (44 mL). Lifestyle Take daily care of your teeth and gums. Brush your teeth every morning and night with fluoride toothpaste. Floss one time each day. Stay active. Exercise for at least 30 minutes 5 or more days each week. Do not use any products that contain nicotine or tobacco, such as cigarettes, e-cigarettes, and chewing tobacco. If you need help quitting, ask your health care provider. Do not use drugs. If you are sexually active, practice safe sex. Use a condom or other form of protection to prevent STIs (sexually transmitted infections). If told by your health care provider, take low-dose aspirin daily starting at age 5. Find healthy ways to cope with stress, such as: Meditation, yoga, or listening to music. Journaling. Talking to a trusted person. Spending time with friends and family. Safety Always wear your seat belt while driving or riding in a vehicle. Do not drive: If you have been drinking alcohol. Do not ride with someone who has been drinking. When you are tired or distracted. While texting. Wear a helmet and other protective equipment during sports activities. If you have firearms in your house, make sure you follow all gun safety procedures. What's next? Go to your health care provider once a year for an annual wellness visit. Ask your health care provider how often you should have your eyes and teeth checked. Stay up to  date on all vaccines. This information is not intended to replace advice given to you by your health care provider. Make sure you discuss any questions you have with your health care provider. Document Revised: 05/16/2020 Document Reviewed: 03/02/2018 Elsevier Patient Education  2022 [employer].

## 2020-12-10 NOTE — Assessment & Plan Note (Signed)
No recent flares.  Follows with urology.

## 2020-12-10 NOTE — Assessment & Plan Note (Signed)
Longstanding history.  Worsened recently.  Has tried conservative measures but symptoms still persist.  We will check x-rays today.  Will likely need MRI depending on results.

## 2020-12-10 NOTE — Progress Notes (Signed)
Chief Complaint:  Michael Duffy is a 55 y.o. male who presents today for his annual comprehensive physical exam.    Assessment/Plan:  Chronic Problems Addressed Today: Hyperglycemia Check A1c.  Dyslipidemia Check lipids.  Back pain, lumbosacral Longstanding history.  Worsened recently.  Has tried conservative measures but symptoms still persist.  We will check x-rays today.  Will likely need MRI depending on results.  BPH (benign prostatic hyperplasia) Follows with urology.  PSA has been elevated.  Will be undergoing prostate biopsy soon.  He is on alfuzosin 10 mg daily.  Kidney stones   No recent flares.  Follows with urology.  Essential hypertension At goal.  He has been taking olmesartan 40 mg daily and HCTZ 12.5 mg daily.  Check labs today.  Refill sent in for combination olmesartan-HCTZ 40-12.5 daily.  Body mass index is 33.91 kg/m. / Obese  BMI Metric Follow Up - 12/10/20 0959       BMI Metric Follow Up-Please document annually   BMI Metric Follow Up Education provided             Preventative Healthcare: Check Labs.  Shingles vaccine given today.  Will be getting flu vaccine at work.  Will be getting COVID booster soon.  Patient Counseling(The following topics were reviewed and/or handout was given):  -Nutrition: Stressed importance of moderation in sodium/caffeine intake, saturated fat and cholesterol, caloric balance, sufficient intake of fresh fruits, vegetables, and fiber.  -Stressed the importance of regular exercise.   -Substance Abuse: Discussed cessation/primary prevention of tobacco, alcohol, or other drug use; driving or other dangerous activities under the influence; availability of treatment for abuse.   -Injury prevention: Discussed safety belts, safety helmets, smoke detector, smoking near bedding or upholstery.   -Sexuality: Discussed sexually transmitted diseases, partner selection, use of condoms, avoidance of unintended pregnancy and  contraceptive alternatives.   -Dental health: Discussed importance of regular tooth brushing, flossing, and dental visits.  -Health maintenance and immunizations reviewed. Please refer to Health maintenance section.  Return to care in 1 year for next preventative visit.     Subjective:  HPI:  He has no acute complaints today.   CC of the Pt is back pain in his lower back. 10 years ago he had a diagnosis for a collapsed disc. Over the past recent year however the pain has worsened significantly, describing the pain as an aching sensation. He used to get up and go run, but now he has to have a concern about his capability to do so, due to the pain. He denies sciatica. He states that he experiences a heaviness, which increases with sneezing or coughing.  Currently the pain is manageable, but feels the need to take medication to alleviate it at least for his exercise.  Also he states that he had a PSA of 9 and so is getting an MRI scheduled to examine the cause. He does not know of any FMHx of prostate cancer.  He had been prescribed Flomax, but it caused ejaculatory problems and had the medication replaced with Alfuzosin.  He denies having problems with kidney stones or uric acid related complications.   Also, he has concerns about low testosterone due to becoming noticeably fatigued recently after workouts.  Lifestyle Diet: Reasonably healthy diet Exercise: Runs, plays tennis and uses treadmill.  Depression screen PHQ 2/9 12/10/2020  Decreased Interest 0  Down, Depressed, Hopeless 0  PHQ - 2 Score 0    Health Maintenance Due  Topic Date Due  HIV Screening  Never done   Hepatitis C Screening  Never done   TETANUS/TDAP  Never done   Zoster Vaccines- Shingrix (1 of 2) Never done   COVID-19 Vaccine (4 - Booster for Pfizer series) 04/22/2020     ROS: Per HPI, otherwise a complete review of systems was negative.   PMH:  The following were reviewed and entered/updated in  epic: Past Medical History:  Diagnosis Date   BPH (benign prostatic hyperplasia)    Hypertension    Patient Active Problem List   Diagnosis Date Noted   Dyslipidemia 12/11/2019   Hyperglycemia 12/11/2019   Essential hypertension 12/10/2019   Kidney stones 12/10/2019   BPH (benign prostatic hyperplasia) 12/10/2019   Back pain, lumbosacral 12/10/2019   Hidradenitis 12/10/2019   Past Surgical History:  Procedure Laterality Date   COLONOSCOPY  03/10/2020   Wilfrid Lund III, MD   LIPOMA EXCISION      Family History  Problem Relation Age of Onset   Hypertension Mother    Hypertension Sister    Colon cancer Neg Hx    Colon polyps Neg Hx    Esophageal cancer Neg Hx    Rectal cancer Neg Hx    Stomach cancer Neg Hx     Medications- reviewed and updated Current Outpatient Medications  Medication Sig Dispense Refill   alfuzosin (UROXATRAL) 10 MG 24 hr tablet Take 1 tablet by mouth daily 90 tablet 3   aspirin 81 MG EC tablet by Mouth/Throat route.     clindamycin-benzoyl peroxide (BENZACLIN) gel APPLY 1 GRAM ON THE AFFECTED AREA OF SKIN EVERY OTHER NIGHT AS DIRECTED 50 g 11   doxycycline (VIBRAMYCIN) 100 MG capsule TAKE 1 CAPSULE BY MOUTH TWICE A DAY 60 capsule 11   olmesartan-hydrochlorothiazide (BENICAR HCT) 40-12.5 MG tablet Take 1 tablet by mouth daily. 90 tablet 3   levofloxacin (LEVAQUIN) 750 MG tablet Take 1 tablet by mouth the morning of procedure (Patient not taking: Reported on 12/10/2020) 1 tablet 0   No current facility-administered medications for this visit.    Allergies-reviewed and updated No Known Allergies  Social History   Socioeconomic History   Marital status: Married    Spouse name: Not on file   Number of children: Not on file   Years of education: Not on file   Highest education level: Not on file  Occupational History   Not on file  Tobacco Use   Smoking status: Never   Smokeless tobacco: Never  Vaping Use   Vaping Use: Never used  Substance  and Sexual Activity   Alcohol use: Not Currently    Comment: one or twice per year    Drug use: Never   Sexual activity: Yes  Other Topics Concern   Not on file  Social History Narrative   Not on file   Social Determinants of Health   Financial Resource Strain: Not on file  Food Insecurity: Not on file  Transportation Needs: Not on file  Physical Activity: Not on file  Stress: Not on file  Social Connections: Not on file        Objective:  Physical Exam: BP 116/66   Pulse 66   Temp 98.1 F (36.7 C) (Temporal)   Ht 5\' 9"  (1.753 m)   Wt 229 lb 9.6 oz (104.1 kg)   SpO2 99%   BMI 33.91 kg/m   Body mass index is 33.91 kg/m. Wt Readings from Last 3 Encounters:  12/10/20 229 lb 9.6 oz (104.1 kg)  03/10/20  233 lb (105.7 kg)  02/25/20 233 lb (105.7 kg)   Gen: NAD, resting comfortably HEENT: TMs normal bilaterally. OP clear. No thyromegaly noted.  CV: RRR with no murmurs appreciated Pulm: NWOB, CTAB with no crackles, wheezes, or rhonchi GI: Normal bowel sounds present. Soft, Nontender, Nondistended. MSK: no edema, cyanosis, or clubbing noted Skin: warm, dry Neuro: CN2-12 grossly intact. Strength 5/5 in upper and lower extremities. Reflexes symmetric and intact bilaterally.  Psych: Normal affect and thought content     I,Jordan Kelly,acting as a scribe for Dimas Chyle, MD.,have documented all relevant documentation on the behalf of Dimas Chyle, MD,as directed by  Dimas Chyle, MD while in the presence of Dimas Chyle, MD.  I, Dimas Chyle, MD, have reviewed all documentation for this visit. The documentation on 12/10/20 for the exam, diagnosis, procedures, and orders are all accurate and complete.  Algis Greenhouse. Jerline Pain, MD 12/10/2020 9:59 AM

## 2020-12-15 NOTE — Progress Notes (Signed)
Please inform patient of the following:  Xray shows degenerative changes. Recommend MRI lumbar spine without contrast. Please place order if he is agreeable.  Algis Greenhouse. Jerline Pain, MD 12/15/2020 7:58 AM

## 2020-12-15 NOTE — Progress Notes (Signed)
Please inform patient of the following:  Labs are all stable. Would like for him to keep up the good work and we can recheck in a year.  Algis Greenhouse. Jerline Pain, MD 12/15/2020 8:00 AM

## 2020-12-16 ENCOUNTER — Encounter: Payer: Self-pay | Admitting: Family Medicine

## 2020-12-16 ENCOUNTER — Other Ambulatory Visit: Payer: Self-pay | Admitting: *Deleted

## 2020-12-16 ENCOUNTER — Other Ambulatory Visit (HOSPITAL_COMMUNITY): Payer: Self-pay

## 2020-12-16 MED ORDER — DICLOFENAC SODIUM 75 MG PO TBEC
75.0000 mg | DELAYED_RELEASE_TABLET | Freq: Two times a day (BID) | ORAL | 1 refills | Status: DC
  Filled 2020-12-16: qty 60, 30d supply, fill #0
  Filled 2021-01-27: qty 60, 30d supply, fill #1

## 2020-12-17 ENCOUNTER — Encounter: Payer: Self-pay | Admitting: Family Medicine

## 2020-12-17 ENCOUNTER — Other Ambulatory Visit: Payer: Self-pay | Admitting: *Deleted

## 2020-12-17 DIAGNOSIS — M545 Low back pain, unspecified: Secondary | ICD-10-CM

## 2020-12-19 ENCOUNTER — Other Ambulatory Visit: Payer: Self-pay | Admitting: Family Medicine

## 2020-12-19 ENCOUNTER — Other Ambulatory Visit (HOSPITAL_COMMUNITY): Payer: Self-pay

## 2020-12-19 MED ORDER — DIAZEPAM 10 MG PO TABS
10.0000 mg | ORAL_TABLET | Freq: Once | ORAL | 0 refills | Status: AC
  Filled 2020-12-19: qty 1, 1d supply, fill #0

## 2020-12-22 ENCOUNTER — Other Ambulatory Visit (HOSPITAL_COMMUNITY): Payer: Self-pay

## 2020-12-22 MED FILL — Doxycycline Hyclate Cap 100 MG: ORAL | 30 days supply | Qty: 60 | Fill #5 | Status: AC

## 2021-01-17 ENCOUNTER — Ambulatory Visit
Admission: RE | Admit: 2021-01-17 | Discharge: 2021-01-17 | Disposition: A | Discharge status: Home / Self Care | Payer: 59 | Source: Ambulatory Visit | Attending: Family Medicine | Admitting: Family Medicine

## 2021-01-17 ENCOUNTER — Other Ambulatory Visit: Payer: Self-pay

## 2021-01-17 DIAGNOSIS — M545 Low back pain, unspecified: Secondary | ICD-10-CM

## 2021-01-17 DIAGNOSIS — M47816 Spondylosis without myelopathy or radiculopathy, lumbar region: Secondary | ICD-10-CM | POA: Diagnosis not present

## 2021-01-17 IMAGING — MR MR LUMBAR SPINE W/O CM
4 of 5 series · 26 of 48 positions shown · non-contrast
Comparison: X-ray lumbar [DATE].

CLINICAL DATA: Low back pain for 1 year. History of disc prolapse
10 years ago.

EXAM:
MRI LUMBAR SPINE WITHOUT CONTRAST
TECHNIQUE: Multiplanar, multisequence MR imaging of the lumbar spine was
performed. No intravenous contrast was administered.

[Series 2: T2 · sagittal · 4.0mm · 0.53mm/px · 6 of 15 slices shown (1 of 2)]
[im 1/15]
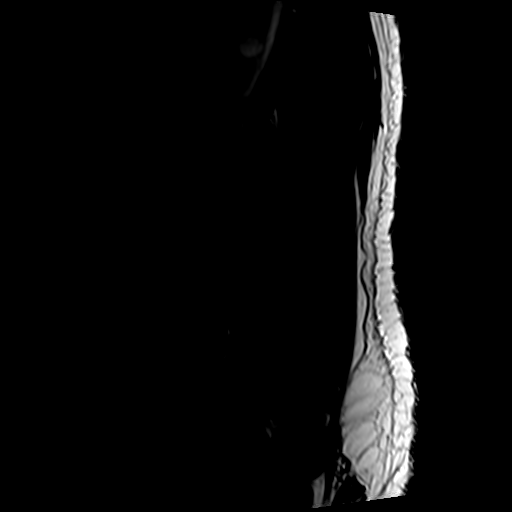
[im 3/15]
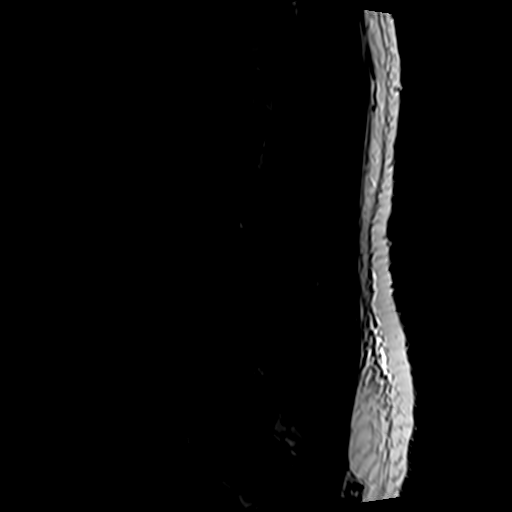
[im 6/15]
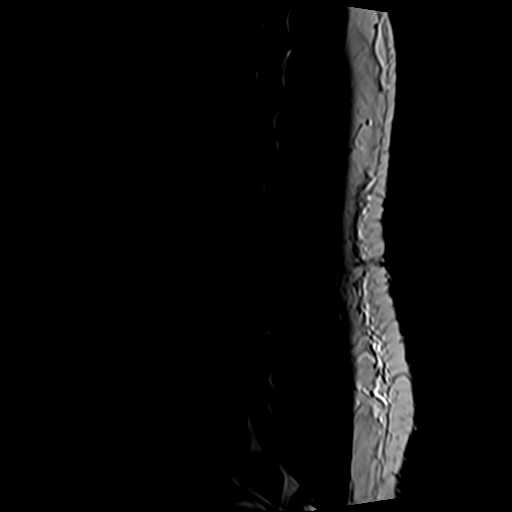
[im 9/15]
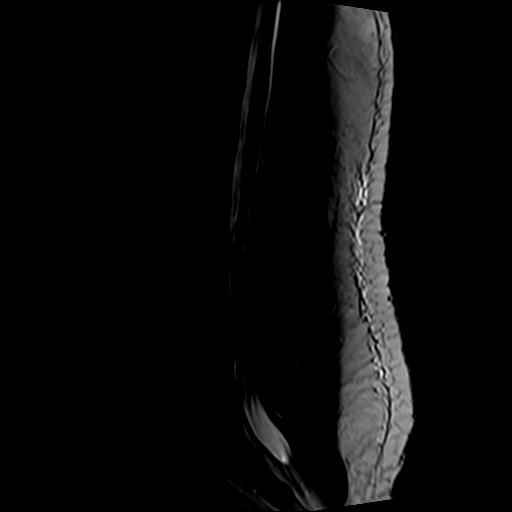
[im 12/15]
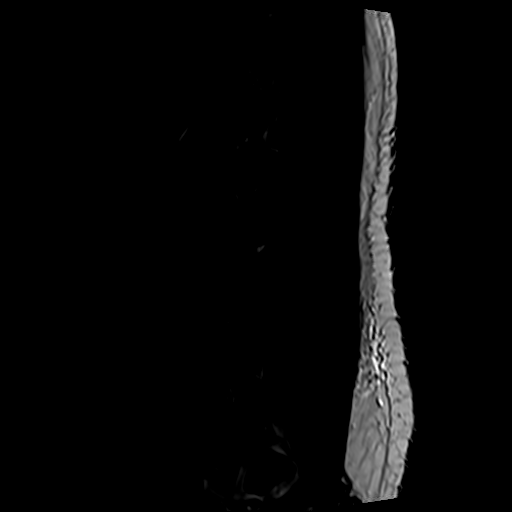
[im 15/15]
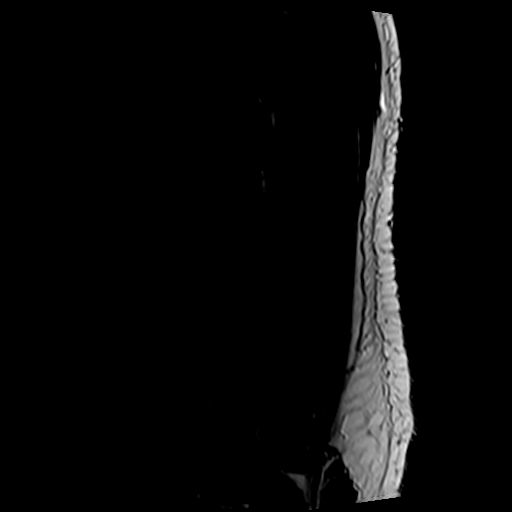

[Series 4: T1 · sagittal · 4.0mm · 0.53mm/px · 6 of 15 slices shown (1 of 2)]
[im 1/15]
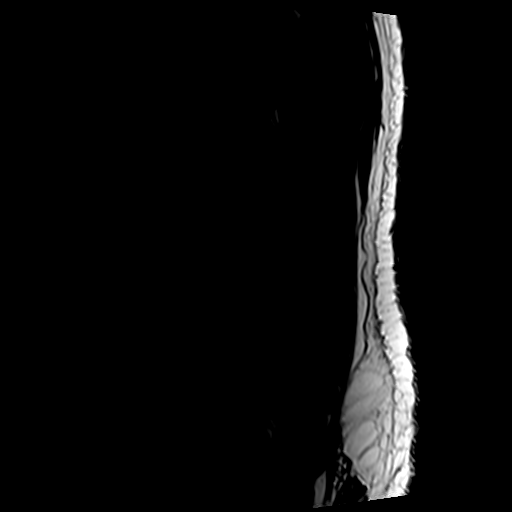
[im 3/15]
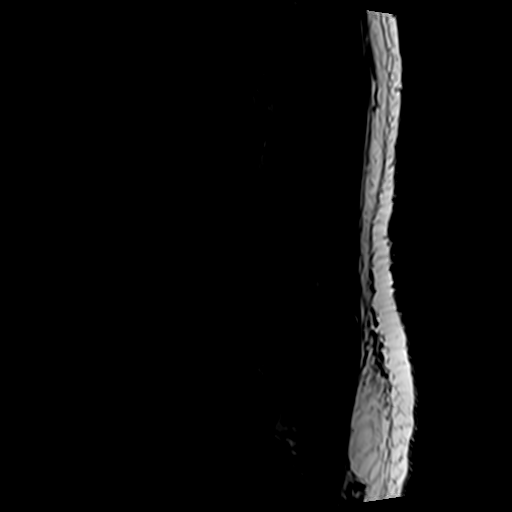
[im 6/15]
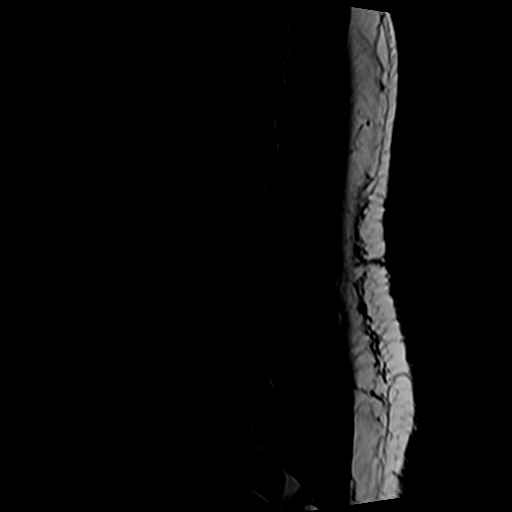
[im 9/15]
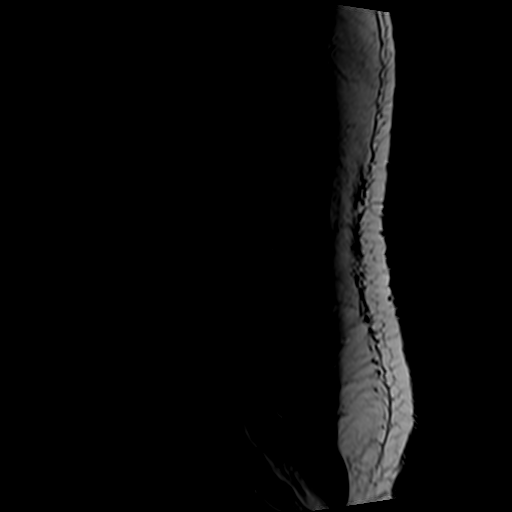
[im 12/15]
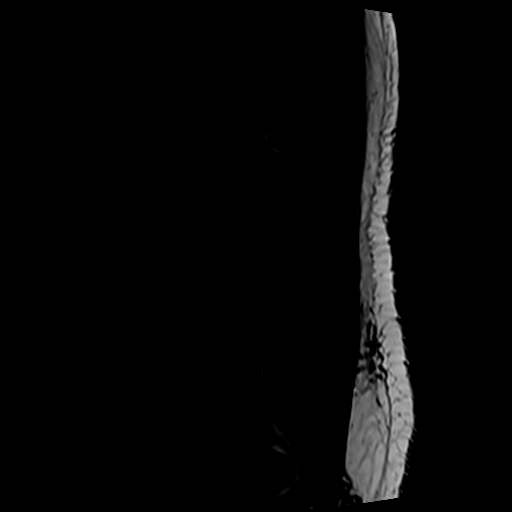
[im 15/15]
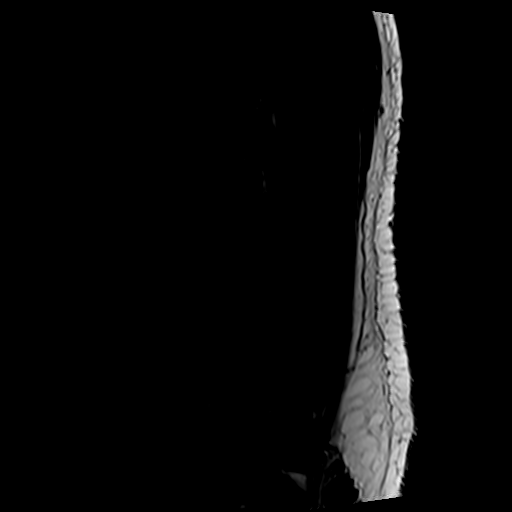

[Series 5: T2 · axial · 4.0mm · 0.70mm/px · z∈[-108,+105]mm · 9 of 40 slices shown (2 of 2)]
[im 1/40]
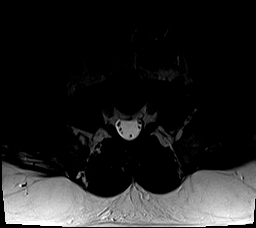
[im 6/40]
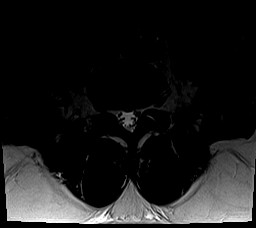
[im 12/40]
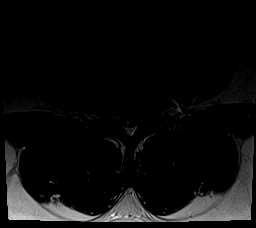
[im 17/40]
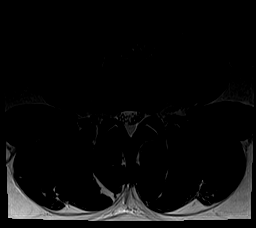
[im 20/40]
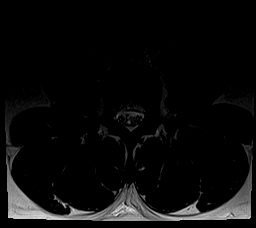
[im 23/40]
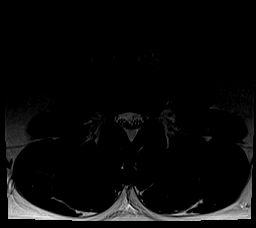
[im 28/40]
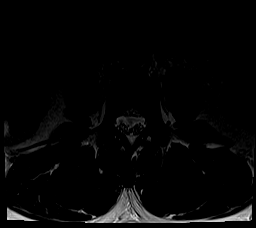
[im 34/40]
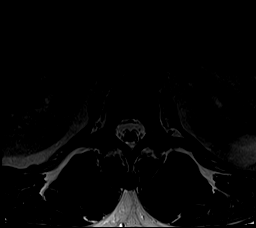
[im 40/40]
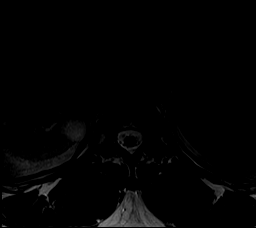

[Series 6: T1 · axial · 4.0mm · 0.35mm/px · z∈[-108,+74]mm · 5 of 40 slices shown (2 of 2)]
[im 1/40]
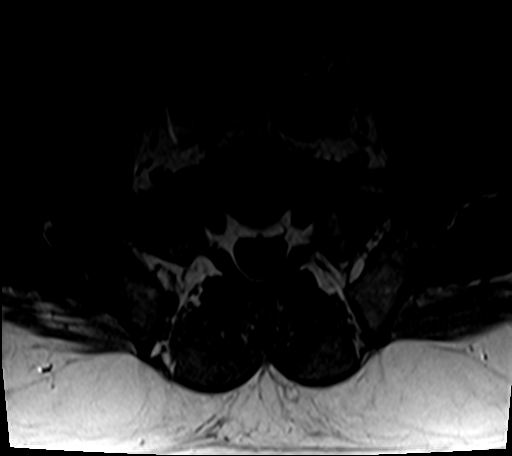
[im 6/40]
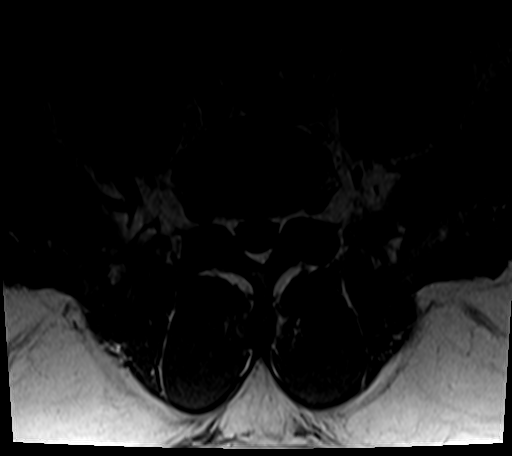
[im 12/40]
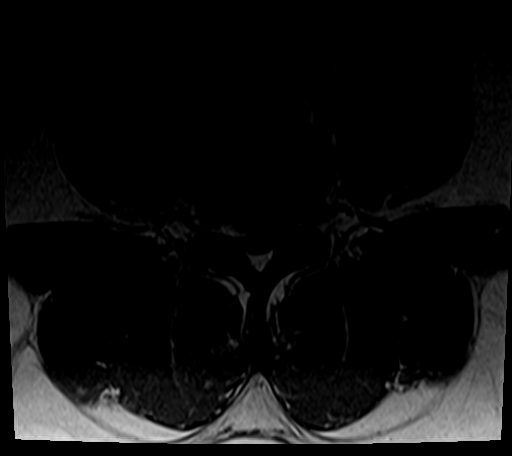
[im 20/40]
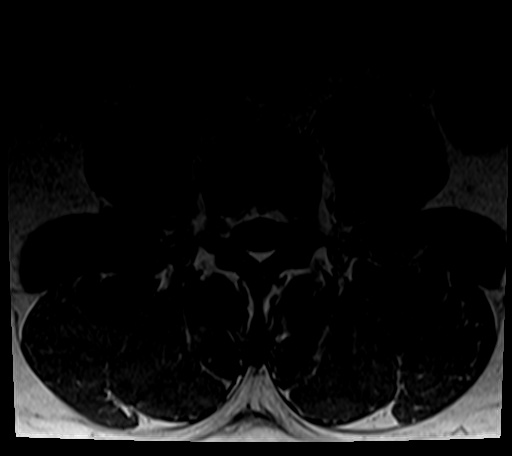
[im 34/40]
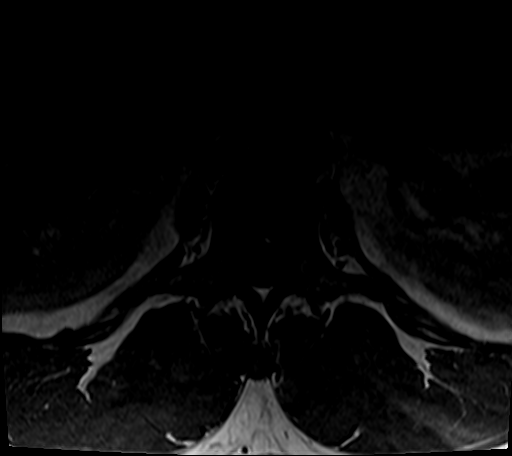

[26 of 48 positions shown; findings below may reference images not displayed]

FINDINGS: Segmentation:  Standard.

Alignment:  No significant listhesis.

Vertebrae: Vertebral body heights are maintained. There is no marrow
edema. No suspicious osseous lesion.

Conus medullaris and cauda equina: Conus extends to the L2 level.
Conus and cauda equina appear normal.

Paraspinal and other soft tissues: Unremarkable.

Disc levels:

L1-L2:  No stenosis.

L2-L3:  No stenosis.

L3-L4:  No stenosis.

L4-L5: Disc desiccation and mild height loss. Disc bulge with
superimposed central extrusion extending slightly above disc level.
Mild facet arthropathy with ligamentum flavum thickening. Moderate
canal stenosis with narrowing of the left greater than right
subarticular recesses. Minor right and mild left foraminal stenosis.

L5-S1:  No canal or foraminal stenosis.
IMPRESSION: Degenerative changes primarily at L4-L5 as detailed above.

## 2021-01-19 DIAGNOSIS — L700 Acne vulgaris: Secondary | ICD-10-CM | POA: Diagnosis not present

## 2021-01-20 NOTE — Progress Notes (Signed)
Please inform patient of the following:  MRI shows degenerative changes and moderate canal stenosis and minor foraminal stenosis. Recommend referral to orthopedics for further management if he wishes.  Michael Duffy. Jerline Pain, MD 01/20/2021 12:54 PM

## 2021-01-27 ENCOUNTER — Other Ambulatory Visit (HOSPITAL_COMMUNITY): Payer: Self-pay

## 2021-01-27 MED FILL — Doxycycline Hyclate Cap 100 MG: ORAL | 30 days supply | Qty: 60 | Fill #6 | Status: AC

## 2021-01-27 MED FILL — Clindamycin Phosphate-Benzoyl Peroxide Gel 1-5%: CUTANEOUS | 30 days supply | Qty: 50 | Fill #1 | Status: AC

## 2021-01-30 DIAGNOSIS — R972 Elevated prostate specific antigen [PSA]: Secondary | ICD-10-CM | POA: Diagnosis not present

## 2021-01-30 DIAGNOSIS — C61 Malignant neoplasm of prostate: Secondary | ICD-10-CM | POA: Diagnosis not present

## 2021-02-03 ENCOUNTER — Other Ambulatory Visit: Payer: Self-pay | Admitting: *Deleted

## 2021-02-03 DIAGNOSIS — M545 Low back pain, unspecified: Secondary | ICD-10-CM

## 2021-02-09 ENCOUNTER — Other Ambulatory Visit: Payer: Self-pay

## 2021-02-09 ENCOUNTER — Ambulatory Visit (HOSPITAL_BASED_OUTPATIENT_CLINIC_OR_DEPARTMENT_OTHER): Payer: 59 | Admitting: Orthopaedic Surgery

## 2021-02-09 DIAGNOSIS — M5136 Other intervertebral disc degeneration, lumbar region: Secondary | ICD-10-CM | POA: Diagnosis not present

## 2021-02-09 NOTE — Progress Notes (Signed)
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  Since that time he has been using an inversion table which helps him significantly.  He has not had any injections.  He takes Tylenol as well as Aleve as needed.  He is able to work a full day without significant pain.  He does endorse some heaviness about the lower back in the morning.  Here today for further assessment and evaluation.  Specifically wanting to see if there is any type of activity that can progress this or make it worse.    Surgical History:   None  PMH/PSH/Family History/Social History/Meds/Allergies:    Past Medical History:  Diagnosis Date   BPH (benign prostatic hyperplasia)    Hypertension    Past Surgical History:  Procedure Laterality Date   COLONOSCOPY  03/10/2020   Michael Kearns, MD   LIPOMA EXCISION     Social History   Socioeconomic History   Marital status: Married    Spouse name: Not on file   Number of children: Not on file   Years of education: Not on file   Highest education level: Not on file  Occupational History   Not on file  Tobacco Use   Smoking status: Never   Smokeless tobacco: Never  Vaping Use   Vaping Use: Never used  Substance and Sexual Activity   Alcohol use: Not Currently    Comment: one or twice per year    Drug use: Never   Sexual activity: Yes  Other Topics Concern   Not on file  Social History Narrative   Not on file   Social Determinants of Health   Financial Resource Strain: Not on file  Food Insecurity: Not on file  Transportation Needs: Not on file  Physical Activity: Not on file  Stress: Not on file  Social Connections: Not on file   Family  History  Problem Relation Age of Onset   Hypertension Mother    Hypertension Sister    Colon cancer Neg Hx    Colon polyps Neg Hx    Esophageal cancer Neg Hx    Rectal cancer Neg Hx    Stomach cancer Neg Hx    No Known Allergies Current Outpatient Medications  Medication Sig Dispense Refill   alfuzosin (UROXATRAL) 10 MG 24 hr tablet Take 1 tablet by mouth daily 90 tablet 3   aspirin 81 MG EC tablet by Mouth/Throat route.     clindamycin-benzoyl peroxide (BENZACLIN) gel APPLY ON THE AFFECTED AREA OF SKIN AS DIRECTED EVERY OTHER NIGHT  50 g 11   diclofenac (VOLTAREN) 75 MG EC tablet Take 1 tablet (75 mg total) by mouth 2 (two) times daily. 60 tablet 1   doxycycline (VIBRAMYCIN) 100 MG capsule TAKE 1 CAPSULE BY MOUTH TWICE A DAY 60 capsule 11   levofloxacin (LEVAQUIN) 750 MG tablet Take 1 tablet by mouth the morning of procedure (Patient not taking: Reported on 12/10/2020) 1 tablet 0   olmesartan-hydrochlorothiazide (BENICAR HCT) 40-12.5 MG tablet Take 1  tablet by mouth daily. 90 tablet 3   No current facility-administered medications for this visit.   No results found.  Review of Systems:   A ROS was performed including pertinent positives and negatives as documented in the HPI.  Physical Exam :   Constitutional: NAD and appears stated age Neurological: Alert and oriented Psych: Appropriate affect and cooperative There were no vitals taken for this visit.   Comprehensive Musculoskeletal Exam:    Tenderness about the paraspinal muscles of the lumbar spine.  No pain with forward flexion or rotation about the cord.  No radiating pain down the leg.  Full strength and sensation bilateral lower extremities  Imaging:     MRI (lumbar spine): There is a degenerated disc at the level of L4-L5 without significant herniation  I personally reviewed and interpreted the radiographs.   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  Together  we discussed the prognosis with degenerative disc.  Overall I have advised that surgical treatment of this condition is fraught with complication.  In general I recommended to continue to use his inversion table and I have advised him on specific exercises and core strengthening which she will build into his gym routine.  He is very satisfied with this answer and we will continue to treat it symptomatically.  Plan :    -He will follow-up as needed    I personally saw and evaluated the patient, and participated in the management and treatment plan.  Vanetta Mulders, MD Attending Physician, Orthopedic Surgery  This document was dictated using Dragon voice recognition software. A reasonable attempt at proof reading has been made to minimize errors.

## 2021-02-20 ENCOUNTER — Ambulatory Visit: Payer: 59 | Attending: Internal Medicine

## 2021-02-20 ENCOUNTER — Other Ambulatory Visit (HOSPITAL_BASED_OUTPATIENT_CLINIC_OR_DEPARTMENT_OTHER): Payer: Self-pay

## 2021-02-20 DIAGNOSIS — Z23 Encounter for immunization: Secondary | ICD-10-CM

## 2021-02-20 MED ORDER — PFIZER COVID-19 VAC BIVALENT 30 MCG/0.3ML IM SUSP
INTRAMUSCULAR | 0 refills | Status: DC
  Filled 2021-02-20: qty 0.3, 1d supply, fill #0

## 2021-02-20 NOTE — Progress Notes (Signed)
   Covid-19 Vaccination Clinic  Name:  SRIJAN GIVAN    MRN: [id number] DOB: 20-Oct-1965  02/20/2021  Mr. Rosell was observed post Covid-19 immunization for 15 minutes without incident. He was provided with Vaccine Information Sheet and instruction to access the V-Safe system.   Mr. Majka was instructed to call 911 with any severe reactions post vaccine: Difficulty breathing  Swelling of face and throat  A fast heartbeat  A bad rash all over body  Dizziness and weakness   Immunizations Administered     Name Date Dose VIS Date Route   Pfizer Covid-19 Vaccine Bivalent Booster 02/20/2021 10:14 AM 0.3 mL 11/19/2020 Intramuscular   Manufacturer: [place]   Lot: [id number]   [place]: [phone]

## 2021-03-04 ENCOUNTER — Other Ambulatory Visit: Payer: Self-pay

## 2021-03-04 ENCOUNTER — Ambulatory Visit (INDEPENDENT_AMBULATORY_CARE_PROVIDER_SITE_OTHER): Payer: 59

## 2021-03-04 DIAGNOSIS — Z23 Encounter for immunization: Secondary | ICD-10-CM | POA: Diagnosis not present

## 2021-03-04 NOTE — Progress Notes (Signed)
Pt had #2 Shingles vaccine in the office today. He tolerated well.

## 2021-03-25 ENCOUNTER — Other Ambulatory Visit (HOSPITAL_COMMUNITY): Payer: Self-pay

## 2021-04-02 DIAGNOSIS — C61 Malignant neoplasm of prostate: Secondary | ICD-10-CM | POA: Diagnosis not present

## 2021-04-10 ENCOUNTER — Other Ambulatory Visit: Payer: Self-pay | Admitting: Family Medicine

## 2021-04-10 ENCOUNTER — Other Ambulatory Visit (HOSPITAL_COMMUNITY): Payer: Self-pay

## 2021-04-10 MED ORDER — DICLOFENAC SODIUM 75 MG PO TBEC
75.0000 mg | DELAYED_RELEASE_TABLET | Freq: Two times a day (BID) | ORAL | 1 refills | Status: DC
  Filled 2021-04-10: qty 60, 30d supply, fill #0

## 2021-04-28 ENCOUNTER — Other Ambulatory Visit (HOSPITAL_COMMUNITY): Payer: Self-pay

## 2021-04-28 DIAGNOSIS — Z20822 Contact with and (suspected) exposure to covid-19: Secondary | ICD-10-CM | POA: Diagnosis not present

## 2021-04-28 DIAGNOSIS — C61 Malignant neoplasm of prostate: Secondary | ICD-10-CM | POA: Diagnosis not present

## 2021-04-28 MED ORDER — TADALAFIL 20 MG PO TABS
ORAL_TABLET | ORAL | 11 refills | Status: DC
  Filled 2021-04-28: qty 30, 30d supply, fill #0
  Filled 2021-07-11: qty 30, 30d supply, fill #1
  Filled 2021-08-23: qty 30, 30d supply, fill #2

## 2021-04-29 ENCOUNTER — Other Ambulatory Visit (HOSPITAL_COMMUNITY): Payer: Self-pay

## 2021-04-29 DIAGNOSIS — Z79899 Other long term (current) drug therapy: Secondary | ICD-10-CM | POA: Diagnosis not present

## 2021-04-29 DIAGNOSIS — C61 Malignant neoplasm of prostate: Secondary | ICD-10-CM | POA: Diagnosis not present

## 2021-04-29 DIAGNOSIS — N400 Enlarged prostate without lower urinary tract symptoms: Secondary | ICD-10-CM | POA: Diagnosis not present

## 2021-04-29 DIAGNOSIS — I10 Essential (primary) hypertension: Secondary | ICD-10-CM | POA: Diagnosis not present

## 2021-04-29 DIAGNOSIS — Z791 Long term (current) use of non-steroidal anti-inflammatories (NSAID): Secondary | ICD-10-CM | POA: Diagnosis not present

## 2021-04-29 DIAGNOSIS — Z87442 Personal history of urinary calculi: Secondary | ICD-10-CM | POA: Diagnosis not present

## 2021-04-30 ENCOUNTER — Other Ambulatory Visit (HOSPITAL_COMMUNITY): Payer: Self-pay

## 2021-04-30 MED ORDER — OXYCODONE HCL 5 MG PO TABS
ORAL_TABLET | ORAL | 0 refills | Status: DC
  Filled 2021-04-30: qty 10, 2d supply, fill #0

## 2021-04-30 MED ORDER — LIDOCAINE 5 % EX PTCH
MEDICATED_PATCH | CUTANEOUS | 0 refills | Status: DC
  Filled 2021-04-30: qty 7, 7d supply, fill #0

## 2021-04-30 MED ORDER — OXYBUTYNIN CHLORIDE 5 MG PO TABS
ORAL_TABLET | ORAL | 0 refills | Status: DC
  Filled 2021-04-30: qty 42, 14d supply, fill #0

## 2021-04-30 MED ORDER — POLYETHYLENE GLYCOL 3350 17 G PO PACK: PACK | ORAL | 0 refills | Status: DC

## 2021-04-30 MED ORDER — CIPROFLOXACIN HCL 500 MG PO TABS
ORAL_TABLET | ORAL | 0 refills | Status: DC
  Filled 2021-04-30: qty 1, 1d supply, fill #0

## 2021-04-30 MED ORDER — ENOXAPARIN SODIUM 40 MG/0.4ML IJ SOSY
PREFILLED_SYRINGE | INTRAMUSCULAR | 0 refills | Status: DC
  Filled 2021-04-30: qty 5.6, 14d supply, fill #0

## 2021-04-30 MED ORDER — BACITRACIN 500 UNIT/GM EX OINT
TOPICAL_OINTMENT | CUTANEOUS | 0 refills | Status: DC
Start: 2021-04-30 — End: 2021-05-14

## 2021-04-30 MED ORDER — CELECOXIB 100 MG PO CAPS
ORAL_CAPSULE | ORAL | 0 refills | Status: DC
  Filled 2021-04-30: qty 14, 7d supply, fill #0

## 2021-04-30 MED ORDER — SENNOSIDES-DOCUSATE SODIUM 8.6-50 MG PO TABS: ORAL_TABLET | ORAL | 0 refills | Status: DC

## 2021-05-12 ENCOUNTER — Other Ambulatory Visit (HOSPITAL_COMMUNITY): Payer: Self-pay

## 2021-05-12 DIAGNOSIS — Z4889 Encounter for other specified surgical aftercare: Secondary | ICD-10-CM | POA: Diagnosis not present

## 2021-05-12 DIAGNOSIS — Z9079 Acquired absence of other genital organ(s): Secondary | ICD-10-CM | POA: Diagnosis not present

## 2021-05-12 DIAGNOSIS — C61 Malignant neoplasm of prostate: Secondary | ICD-10-CM | POA: Diagnosis not present

## 2021-05-12 DIAGNOSIS — N393 Stress incontinence (female) (male): Secondary | ICD-10-CM | POA: Diagnosis not present

## 2021-05-12 DIAGNOSIS — M6281 Muscle weakness (generalized): Secondary | ICD-10-CM | POA: Diagnosis not present

## 2021-05-12 MED ORDER — TADALAFIL 20 MG PO TABS
ORAL_TABLET | ORAL | 3 refills | Status: DC
  Filled 2021-05-12: qty 20, 90d supply, fill #0

## 2021-05-13 ENCOUNTER — Encounter: Payer: Self-pay | Admitting: Family Medicine

## 2021-05-13 NOTE — Telephone Encounter (Signed)
See note and please advise

## 2021-05-13 NOTE — Telephone Encounter (Signed)
We can send in trazadone 50mg  nightly if he wishes to try this. He can also schedule an appoint to discuss options.  Algis Greenhouse. Jerline Pain, MD 05/13/2021 12:00 PM

## 2021-05-13 NOTE — Telephone Encounter (Signed)
Called pt and lvm to return my call

## 2021-05-14 ENCOUNTER — Other Ambulatory Visit: Payer: Self-pay

## 2021-05-14 ENCOUNTER — Telehealth: Payer: 59 | Admitting: Family Medicine

## 2021-05-14 ENCOUNTER — Other Ambulatory Visit (HOSPITAL_COMMUNITY): Payer: Self-pay

## 2021-05-14 DIAGNOSIS — G4700 Insomnia, unspecified: Secondary | ICD-10-CM | POA: Diagnosis not present

## 2021-05-14 DIAGNOSIS — C61 Malignant neoplasm of prostate: Secondary | ICD-10-CM | POA: Diagnosis not present

## 2021-05-14 MED ORDER — ZOLPIDEM TARTRATE 10 MG PO TABS
10.0000 mg | ORAL_TABLET | Freq: Every evening | ORAL | 1 refills | Status: DC | PRN
  Filled 2021-05-14: qty 30, 30d supply, fill #0
  Filled 2021-06-08 – 2021-06-11 (×2): qty 30, 30d supply, fill #1

## 2021-05-14 NOTE — Progress Notes (Signed)
° °  Michael Duffy is a 56 y.o. male who presents today for a virtual office visit.  Assessment/Plan:  Chronic Problems Addressed Today: Prostate cancer [facility]) s/p prostatectomy 2023 Still waiting on pathology reports though sounds like this was localized tumor.  We will continue management per oncology and urology.4  Obviously this is a source of stress and anxiety for him however he feels like it is currently manageable.  We discussed medications and referral to therapy however he would like to defer for now.  Insomnia He would like to avoid anticholinergic medications due to concern for possible urinary retention.  We will start Ambien.  We discussed potential side effects.  He will follow-up me in a couple weeks let me know how this is working.  May consider trial of Lunesta if this does not work well.     Subjective:  HPI:  Patient with insomnia.  He was recently diagnosed with prostate cancer a few months ago.  He has been following with oncology and urology for this.  He underwent prostatectomy about 2 weeks ago.  He has been under a lot of stress due to this recent diagnosis.  He is having some urinary incontinence issues.  Has been working with urology and physical therapy to help with this.  He does not have his biopsy report back which is also causing a source of anxiety.  He has typically been using Unisom to help with sleep however he is concerned about possible urinary retention and would like to discuss alternatives.  Has difficulty with rumination and staying asleep.       Objective/Observations  Physical Exam: Gen: NAD, resting comfortably Pulm: Normal work of breathing Neuro: Grossly normal, moves all extremities Psych: Normal affect and thought content  Virtual Visit via Video   I connected with Clanton A Leth on 05/14/21 at  4:00 PM EST by a video enabled telemedicine application and verified that I am speaking with the correct person using two identifiers.  The limitations of evaluation and management by telemedicine and the availability of in person appointments were discussed. The patient expressed understanding and agreed to proceed.   Patient location: Home Provider location: [place] participating in the virtual visit: Myself and Patient     Algis Greenhouse. Jerline Pain, MD 05/14/2021 11:24 AM

## 2021-05-14 NOTE — Telephone Encounter (Signed)
Pt will do a virutal appt with Dr Jerline Pain 05/14/21 at 4 pm

## 2021-05-14 NOTE — Assessment & Plan Note (Signed)
He would like to avoid anticholinergic medications due to concern for possible urinary retention.  We will start Ambien.  We discussed potential side effects.  He will follow-up me in a couple weeks let me know how this is working.  May consider trial of Lunesta if this does not work well.

## 2021-05-14 NOTE — Assessment & Plan Note (Addendum)
Still waiting on pathology reports though sounds like this was localized tumor.  We will continue management per oncology and urology.4  Obviously this is a source of stress and anxiety for him however he feels like it is currently manageable.  We discussed medications and referral to therapy however he would like to defer for now.

## 2021-05-19 ENCOUNTER — Ambulatory Visit: Payer: 59 | Attending: Oncology | Admitting: Physical Therapy

## 2021-05-19 ENCOUNTER — Encounter: Payer: Self-pay | Admitting: Physical Therapy

## 2021-05-19 ENCOUNTER — Other Ambulatory Visit: Payer: Self-pay

## 2021-05-19 DIAGNOSIS — M6281 Muscle weakness (generalized): Secondary | ICD-10-CM | POA: Insufficient documentation

## 2021-05-19 DIAGNOSIS — R279 Unspecified lack of coordination: Secondary | ICD-10-CM

## 2021-05-19 DIAGNOSIS — C61 Malignant neoplasm of prostate: Secondary | ICD-10-CM | POA: Diagnosis not present

## 2021-05-19 NOTE — Therapy (Signed)
OUTPATIENT PHYSICAL THERAPY MALE PELVIC EVALUATION   Patient Name: Michael Duffy MRN: [id number] DOB:Jul 26, 1965, 56 y.o., male Today's Date: 05/19/2021   PT End of Session - 05/19/21 1920     Visit Number 1    Date for PT Re-Evaluation 08/11/21    Authorization Type [facility]    PT Start Time 1530    PT Stop Time 1612    PT Time Calculation (min) 42 min    Activity Tolerance Patient tolerated treatment well    Behavior During Therapy [facility] for tasks assessed/performed             Past Medical History:  Diagnosis Date   BPH (benign prostatic hyperplasia)    Hypertension    Past Surgical History:  Procedure Laterality Date   COLONOSCOPY  03/10/2020   Wray Kearns, MD   LIPOMA EXCISION     Patient Active Problem List   Diagnosis Date Noted   Insomnia 05/14/2021   Prostate cancer [facility]) s/p prostatectomy 2023 05/14/2021   Dyslipidemia 12/11/2019   Hyperglycemia 12/11/2019   Essential hypertension 12/10/2019   Kidney stones 12/10/2019   BPH (benign prostatic hyperplasia) 12/10/2019   Back pain, lumbosacral 12/10/2019   Hidradenitis 12/10/2019    PCP: Vivi Barrack, MD  REFERRING PROVIDER: Clement Sayres,*  REFERRING DIAG: C61 (ICD-10-CM) - Malignant neoplasm of prostate   THERAPY DIAG:  Muscle weakness (generalized)  Unspecified lack of coordination  ONSET DATE: 05/12/2021 prostatectomy  SUBJECTIVE:                                                                                                                                                                                           SUBJECTIVE STATEMENT: Had catheter removed one week ago.  The last two days has 2-3 nocturia.  During the day mostly feel leakage when moving.  Fluid intake: 32-48 H2O  Patient confirms identification and approves PT to assess pelvic floor and treatment - deferred today for external assessment  PERTINENT HISTORY:  Prostate cancer, post prostatectomy, kidney  stones, low back pain   PAIN:  Are you having pain? Yes NPRS scale: 3/10 Pain location:  perinel  Pain type: dull Pain description:  discomfort    Aggravating factors: sitting Relieving factors: standing    BOWEL MOVEMENT Pain with bowel movement: No Type of bowel movement:Strain No Fully empty rectum: Yes:   Leakage: No Pads: No Fiber supplement: No  URINATION Pain with urination: Yes  discomfort due to catheter Fully empty bladder: Yes: can trickle and not sure Stream:  fluctuates Urgency: No Frequency: 1/hour Leakage: Coughing, Sneezing, Lifting, and Bending forward Pads: Yes: 4-5  INTERCOURSE Pain with intercourse: not attempted but no prior issues   PRECAUTIONS: None  WEIGHT BEARING RESTRICTIONS No  FALLS:  Has patient fallen in last 6 months? 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; pulmonologist walking a lot  PLOF: Independent  PATIENT GOALS stop leakage   OBJECTIVE:    COGNITION:  Overall cognitive status: Within functional limits for tasks assessed       MUSCLE LENGTH: Hamstrings: Right 70 deg; Left 70 deg   POSTURE:  Slumped sitting posture  PALPATION: Internal Pelvic Floor deferred  External Perineal Exam not TTP  GENERAL holding breath with pelvic floor contraction  LUMBARAROM/PROM  A/PROM A/PROM  05/19/2021  Flexion WFL  Extension   Right lateral flexion limited  Left lateral flexion   Right rotation limted  Left rotation    (Blank rows = not tested)  LE AROM/PROM:  A/PROM Right 05/19/2021 Left 05/19/2021  Hip flexion Limited 20% Limited 20 %  Hip extension    Hip abduction    Hip adduction    Hip internal rotation    Hip external rotation    Knee flexion    Knee extension    Ankle dorsiflexion    Ankle plantarflexion    Ankle inversion    Ankle eversion     (Blank rows = not tested)  LE MMT: gorssly  5/5 PELVIC MMT: breathing and holding breath during assessment as noted externally   MMT  05/19/2021  Internal Anal Sphincter   External Anal Sphincter   Puborectalis   Diastasis Recti   (Blank rows = not tested)   TONE: Low tone palpated externally    FUNCTIONAL TESTS:  Single leg stand - normal      TODAY'S TREATMENT  EVAL Access Code: [email or web address] URL: [email or web address] Date: 05/19/2021 Prepared by: Jari Favre  Exercises Standing Hamstring Stretch with Step - 1 x daily - 7 x weekly - 1 sets - 3 reps - 30 sec hold Table Lean - 3 x daily - 7 x weekly - 1 sets - 10 reps Standing Shoulder Flexion to 90 Degrees with Dumbbells - 1 x daily - 7 x weekly - 1 sets - 10 reps Supine Hip Internal and External Rotation - 1 x daily - 7 x weekly - 1 sets - 10 reps - 5 sec hold    PATIENT EDUCATION:  Education details: medbridge Person educated: Patient Education method: Consulting civil engineer, Media planner, Corporate treasurer cues, Verbal cues, and Handouts Education comprehension: verbalized understanding and returned demonstration   HOME EXERCISE PROGRAM: Access Code: [email or web address] URL: [email or web address] Date: 05/19/2021 Prepared by: Jari Favre  Exercises Standing Hamstring Stretch with Step - 1 x daily - 7 x weekly - 1 sets - 3 reps - 30 sec hold Table Lean - 3 x daily - 7 x weekly - 1 sets - 10 reps Standing Shoulder Flexion to 90 Degrees with Dumbbells - 1 x daily - 7 x weekly - 1 sets - 10 reps Supine Hip Internal and External Rotation - 1 x daily - 7 x weekly - 1 sets - 10 reps - 5 sec hold   ASSESSMENT:  CLINICAL IMPRESSION: Patient is a 56 y.o. male who was seen today for physical therapy evaluation and treatment for post prostatectomy surgery and subsequent pelvic floor weakness and incontinence.    OBJECTIVE IMPAIRMENTS decreased coordination, decreased endurance, decreased strength, impaired flexibility, impaired tone, and pain.    ACTIVITY LIMITATIONS  community activity.   PERSONAL FACTORS 1-2 comorbidities: prostate cancer and prostatectomy  are also affecting patient's functional outcome.    REHAB POTENTIAL: Excellent  CLINICAL DECISION MAKING: Stable/uncomplicated  EVALUATION COMPLEXITY: Low   GOALS: Goals reviewed with patient? Yes  SHORT TERM GOALS:  STG Name Target Date Goal status   LTG Name Target Date Goal status  1 Pt will be independent with advanced HEP to maintain improvements made throughout therapy  Baseline: 08/11/2021 INITIAL  2 Pt will be able to functional actions such as getting up from low chair without leakage  Baseline: 08/11/2021 INITIAL  3 Pt will be able to run/workout for 30 minutes without leakage or discomfort  Baseline: 08/11/2021 INITIAL  4 Pt will be able to lift at least 10 lb correctly for 10 reps without pain or leakage for functional activities  Baseline: 08/11/2021 INITIAL  5     6     7       PLAN: PT FREQUENCY: 1x/week  PT DURATION: 12 weeks  PLANNED INTERVENTIONS: Therapeutic exercises, Therapeutic activity, Balance training, Gait training, Patient/Family education, Joint mobilization, Dry Needling, Electrical stimulation, Cryotherapy, Moist heat, Biofeedback, Manual therapy, and Neuro Muscular re-education  PLAN FOR NEXT SESSION: biofeedback   Jule Ser, PT 05/19/2021, 7:23 PM

## 2021-05-29 ENCOUNTER — Encounter: Payer: Self-pay | Admitting: Family Medicine

## 2021-05-29 NOTE — Telephone Encounter (Signed)
FYI

## 2021-06-02 NOTE — Therapy (Signed)
?OUTPATIENT PHYSICAL THERAPY TREATMENT NOTE ? ? ?Patient Name: Michael Duffy ?MRN: [id number] ?DOB:21-Apr-1965, 56 y.o., male ?Today's Date: 06/03/2021 ? ?PCP: Vivi Barrack, MD ?REFERRING PROVIDER: Clement Sayres,* ? ? PT End of Session - 06/03/21 [phone]   ? ? Visit Number 2   ? Date for PT Re-Evaluation 08/11/21   ? Authorization Type [place]   ? PT Start Time [phone]   ? PT Stop Time 1014   ? PT Time Calculation (min) 43 min   ? Activity Tolerance Patient tolerated treatment well   ? Behavior During Therapy [facility] for tasks assessed/performed   ? ?  ?  ? ?  ? ? ?Past Medical History:  ?Diagnosis Date  ? BPH (benign prostatic hyperplasia)   ? Hypertension   ? ?Past Surgical History:  ?Procedure Laterality Date  ? COLONOSCOPY  03/10/2020  ? Wilfrid Lund III, MD  ? LIPOMA EXCISION    ? ?Patient Active Problem List  ? Diagnosis Date Noted  ? Insomnia 05/14/2021  ? Prostate cancer [facility]) s/p prostatectomy 2023 05/14/2021  ? Dyslipidemia 12/11/2019  ? Hyperglycemia 12/11/2019  ? Essential hypertension 12/10/2019  ? Kidney stones 12/10/2019  ? BPH (benign prostatic hyperplasia) 12/10/2019  ? Back pain, lumbosacral 12/10/2019  ? Hidradenitis 12/10/2019  ? ? ?REFERRING DIAG: C61 (ICD-10-CM) - Malignant neoplasm of prostate ? ?THERAPY DIAG:  ?Muscle weakness (generalized) ? ?Unspecified lack of coordination ? ?PERTINENT HISTORY: Prostate cancer, post prostatectomy, kidney stones, low back pain ? ?PRECAUTIONS: N/a ? ?SUBJECTIVE: Getting leakage when getting up from sitting position or running.  Plays pickle ball.  Dryer in the morning ? ?PAIN:  ?Are you having pain? No ? ?ONSET DATE: 05/12/2021 prostatectomy ?  ?  ?URINATION ?Pain with urination: No ?Fully empty bladder: Yes: can trickle and not sure ?Stream:  fluctuates ?Urgency: No ?Frequency: 1/hour ?Leakage: Coughing, Sneezing, running and standing up from sitting ?Pads: Yes: 3 ?  ?INTERCOURSE ?Pain with intercourse: not attempted but no prior issues ?  ?   ?PRECAUTIONS: None ?  ?WEIGHT BEARING RESTRICTIONS No ?  ?FALLS:  ?Has patient fallen in last 6 months? No, ?  ?LIVING ENVIRONMENT: ?Lives with: lives with their family wife and 3 kids ?Lives in: House/apartment ?  ?  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  ?PLOF: Independent ?  ?PATIENT GOALS stop leakage ?  ?  ?OBJECTIVE:  ?  ?  ?COGNITION: ?           Overall cognitive status: Within functional limits for tasks assessed              ?            ?  ?  ?MUSCLE LENGTH: ?Hamstrings: Right 70 deg; Left 70 deg ?  ?  ?POSTURE:  ?Slumped sitting posture ? ?  ?LUMBARAROM/PROM ?  ?A/PROM A/PROM  ?05/19/2021  ?Flexion WFL  ?Extension    ?Right lateral flexion limited  ?Left lateral flexion    ?Right rotation limted  ?Left rotation    ? (Blank rows = not tested) ?  ?LE AROM/PROM: ?  ?A/PROM Right ?05/19/2021 Left ?05/19/2021  ?Hip flexion Limited 20% Limited 20 %  ?Hip extension      ?Hip abduction      ?Hip adduction      ?Hip internal rotation      ?Hip external rotation      ?Knee flexion      ?Knee extension      ?Ankle  dorsiflexion      ?Ankle plantarflexion      ?Ankle inversion      ?Ankle eversion      ? (Blank rows = not tested) ?  ?LE MMT: gorssly 5/5 ?PELVIC MMT: breathing and holding breath during assessment as noted externally ?  ?MMT   ?05/19/2021  ?Internal Anal Sphincter    ?External Anal Sphincter    ?Puborectalis    ?Diastasis Recti    ?(Blank rows = not tested) ?  ?  ?TONE: ?Low tone palpated externally ?  ?  ?  ?FUNCTIONAL TESTS:  ?Single leg stand - normal ?  ?  ?  ?  ?  ?TODAY'S TREATMENT  ?06/03/21 ?March with core engaged working on breathing ?Ball overhead isometric TA ?Ball presses breathing and engage core ?Ball squeeze bridges 8 x feeling in back ?Ball squeeze in supine with exhale ?Sit to stand with kegel 10x ?Wall slide with UE lift 3lb ?  ?  ?  ?PATIENT EDUCATION:  ?Education details: medbridge [id number] ?Person educated: Patient ?Education method: Explanation,  Demonstration, Tactile cues, Verbal cues, and Handouts ?Education comprehension: verbalized understanding and returned demonstration ?  ?  ?HOME EXERCISE PROGRAM: ?Access Code: [email or web address] ?URL: [email or web address] ?Date: 06/03/2021 ?Prepared by: Jari Favre ? ?Exercises ?Standing Hamstring Stretch with Step - 1 x daily - 7 x weekly - 1 sets - 3 reps - 30 sec hold ?Table Lean - 3 x daily - 7 x weekly - 1 sets - 10 reps ?Standing Shoulder Flexion to 90 Degrees with Dumbbells - 1 x daily - 7 x weekly - 1 sets - 10 reps ?Supine Hip Internal and External Rotation - 1 x daily - 7 x weekly - 1 sets - 10 reps - 5 sec hold ?Hooklying Small March - 1 x daily - 7 x weekly - 2 sets - 10 reps ?Supine 90/90 Shoulder Flexion with Abdominal Bracing - 1 x daily - 7 x weekly - 3 sets - 10 reps ?  ?  ?ASSESSMENT: ?  ?CLINICAL IMPRESSION: ?Patient was doing better with exercises at home and has gotten the estim kegel hard unit for home use with exercises. Pt is doing better with exhale and engaging the abdominal and pelvic muscles.  He still needs some cues.  He has good sensation of when he is engaging the muscles.  Pt will benefit from skilled PT to continue to progress core strength for reduced leakage ?  ?  ?OBJECTIVE IMPAIRMENTS decreased coordination, decreased endurance, decreased strength, impaired flexibility, impaired tone, and pain.  ?  ?ACTIVITY LIMITATIONS community activity.  ?  ?PERSONAL FACTORS 1-2 comorbidities: prostate cancer and prostatectomy  are also affecting patient's functional outcome.  ?  ?  ?REHAB POTENTIAL: Excellent ?  ?CLINICAL DECISION MAKING: Stable/uncomplicated ?  ?EVALUATION COMPLEXITY: Low ?  ?  ?GOALS: ?Goals reviewed with patient? Yes ?  ?SHORT TERM GOALS: ?  ?STG Name Target Date Goal status  ?  ?LTG Name Target Date Goal status  ?1 Pt will be independent with advanced HEP to maintain improvements made throughout therapy ?  ?Baseline: 08/11/2021 INITIAL  ?2 Pt will be able  to functional actions such as getting up from low chair without leakage  ?Baseline: 08/11/2021 INITIAL  ?3 Pt will be able to run/workout for 30 minutes without leakage or discomfort  ?Baseline: 08/11/2021 INITIAL  ?4 Pt will be able to lift at least 10 lb correctly for 10 reps without pain or leakage for functional activities  ?Baseline: 08/11/2021 INITIAL  ?  5        ?6        ?7        ?  ?PLAN: ?PT FREQUENCY: 1x/week ?  ?PT DURATION: 12 weeks ?  ?PLANNED INTERVENTIONS: Therapeutic exercises, Therapeutic activity, Balance training, Gait training, Patient/Family education, Joint mobilization, Dry Needling, Electrical stimulation, Cryotherapy, Moist heat, Biofeedback, Manual therapy, and Neuro Muscular re-education ?  ?PLAN FOR NEXT SESSION: progress add hip hinge; cont h/s and lumbar flexibility, core strength quadruped and half kneeling progressions ?  ? ? ? ? ? ? ?Jule Ser, PT ?06/03/2021, 11:16 AM ? ?   ?

## 2021-06-03 ENCOUNTER — Ambulatory Visit: Payer: 59 | Attending: Oncology | Admitting: Physical Therapy

## 2021-06-03 ENCOUNTER — Other Ambulatory Visit: Payer: Self-pay

## 2021-06-03 ENCOUNTER — Encounter: Payer: Self-pay | Admitting: Physical Therapy

## 2021-06-03 DIAGNOSIS — R279 Unspecified lack of coordination: Secondary | ICD-10-CM | POA: Diagnosis not present

## 2021-06-03 DIAGNOSIS — M6281 Muscle weakness (generalized): Secondary | ICD-10-CM | POA: Diagnosis not present

## 2021-06-08 ENCOUNTER — Other Ambulatory Visit (HOSPITAL_COMMUNITY): Payer: Self-pay

## 2021-06-11 ENCOUNTER — Other Ambulatory Visit (HOSPITAL_COMMUNITY): Payer: Self-pay

## 2021-06-12 ENCOUNTER — Ambulatory Visit: Payer: 59 | Admitting: Physical Therapy

## 2021-06-12 ENCOUNTER — Other Ambulatory Visit: Payer: Self-pay

## 2021-06-12 ENCOUNTER — Encounter: Payer: Self-pay | Admitting: Physical Therapy

## 2021-06-12 DIAGNOSIS — R279 Unspecified lack of coordination: Secondary | ICD-10-CM

## 2021-06-12 DIAGNOSIS — M6281 Muscle weakness (generalized): Secondary | ICD-10-CM | POA: Diagnosis not present

## 2021-06-12 NOTE — Therapy (Signed)
?OUTPATIENT PHYSICAL THERAPY TREATMENT NOTE ? ? ?Patient Name: Michael Duffy ?MRN: [id number] ?DOB:19-Nov-1965, 56 y.o., male ?Today's Date: 06/12/2021 ? ?PCP: Vivi Barrack, MD ?REFERRING PROVIDER: Clement Sayres,* ? ? ? ? ?Past Medical History:  ?Diagnosis Date  ? BPH (benign prostatic hyperplasia)   ? Hypertension   ? ?Past Surgical History:  ?Procedure Laterality Date  ? COLONOSCOPY  03/10/2020  ? Wilfrid Lund III, MD  ? LIPOMA EXCISION    ? ?Patient Active Problem List  ? Diagnosis Date Noted  ? Insomnia 05/14/2021  ? Prostate cancer [facility]) s/p prostatectomy 2023 05/14/2021  ? Dyslipidemia 12/11/2019  ? Hyperglycemia 12/11/2019  ? Essential hypertension 12/10/2019  ? Kidney stones 12/10/2019  ? BPH (benign prostatic hyperplasia) 12/10/2019  ? Back pain, lumbosacral 12/10/2019  ? Hidradenitis 12/10/2019  ? ? ?REFERRING DIAG: C61 (ICD-10-CM) - Malignant neoplasm of prostate ? ?THERAPY DIAG:  ?No diagnosis found. ? ?PERTINENT HISTORY: Prostate cancer, post prostatectomy, kidney stones, low back pain ? ?PRECAUTIONS: N/a ? ?SUBJECTIVE: Getting up from sitting a little, but using one pad at day and one at night.  Running a little leaking ? ?PAIN:  ?Are you having pain? No ? ?ONSET DATE: 05/12/2021 prostatectomy ?  ?  ?URINATION ?Pain with urination: No ?Fully empty bladder: Yes: can trickle and not sure ?Stream:  strong ?Urgency: yes when drinking a lot of water ?Frequency: 1 or 1.5/hour ?Leakage: running and standing up from sitting ?Pads: Yes: 2 ?  ?INTERCOURSE ?Pain with intercourse: not attempted but no prior issues ?  ?  ?PRECAUTIONS: None ?  ?WEIGHT BEARING RESTRICTIONS No ?  ?FALLS:  ?Has patient fallen in last 6 months? No, ?  ?LIVING ENVIRONMENT: ?Lives with: lives with their family wife and 3 kids ?Lives in: House/apartment ?  ?  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  ?PLOF: Independent ?  ?PATIENT GOALS stop leakage ?  ?  ?OBJECTIVE:  ?  ? ?MUSCLE LENGTH: ?Hamstrings:  Right 70 deg; Left 70 deg ?  ? ?  ?LUMBARAROM/PROM ?  ?A/PROM A/PROM  ?05/19/2021  ?Flexion WFL  ?Extension    ?Right lateral flexion limited  ?Left lateral flexion    ?Right rotation limted  ?Left rotation    ? (Blank rows = not tested) ?  ?LE AROM/PROM: ?  ?A/PROM Right ?05/19/2021 Left ?05/19/2021  ?Hip flexion Limited 20% Limited 20 %  ?Hip extension      ?Hip abduction      ?Hip adduction      ?Hip internal rotation      ?Hip external rotation      ?Knee flexion      ?Knee extension      ?Ankle dorsiflexion      ?Ankle plantarflexion      ?Ankle inversion      ?Ankle eversion      ? (Blank rows = not tested) ?  ?LE MMT: gorssly 5/5 ? ?PELVIC MMT: breathing and holding breath during assessment as noted externally ?  ?MMT   ?05/19/2021  ?Internal Anal Sphincter    ?External Anal Sphincter    ?Puborectalis    ?Diastasis Recti    ?(Blank rows = not tested) ?  ?  ?  ?  ?TODAY'S TREATMENT  ? ?06/12/21 ?Eliptical L 3 - 3/3 fwd/back - updated status and core engaged ?Dead lift - hip hinge 15x ?Dead lift - 15lb kettle bell 15x ?Cow pose to neutral spine - 10x ?Qped kegel 3 ways- neutral, hip IR  and lumbar ext - 5 x each way ? ?Self care: urge techniques ? ?06/03/21 ?March with core engaged working on breathing ?Ball overhead isometric TA ?Ball presses breathing and engage core ?Ball squeeze bridges 8 x feeling in back ?Ball squeeze in supine with exhale ?Sit to stand with kegel 10x ?Wall slide with UE lift 3lb ?  ?  ?  ?PATIENT EDUCATION:  ?Education details: medbridge [id number] - added dead lift, primal push up, hip IR with kegel ?Person educated: Patient ?Education method: Explanation, Demonstration, Tactile cues, Verbal cues, and Handouts ?Education comprehension: verbalized understanding and returned demonstration ?  ?  ?HOME EXERCISE PROGRAM: ?Access Code: [email or web address] ?URL: [email or web address] ?Date: 06/03/2021 ?Prepared by: Jari Favre ? ?Exercises ?Standing Hamstring Stretch with Step - 1 x daily -  7 x weekly - 1 sets - 3 reps - 30 sec hold ?Table Lean - 3 x daily - 7 x weekly - 1 sets - 10 reps ?Standing Shoulder Flexion to 90 Degrees with Dumbbells - 1 x daily - 7 x weekly - 1 sets - 10 reps ?Supine Hip Internal and External Rotation - 1 x daily - 7 x weekly - 1 sets - 10 reps - 5 sec hold ?Hooklying Small March - 1 x daily - 7 x weekly - 2 sets - 10 reps ?Supine 90/90 Shoulder Flexion with Abdominal Bracing - 1 x daily - 7 x weekly - 3 sets - 10 reps ?  ?  ?ASSESSMENT: ?  ?CLINICAL IMPRESSION: ?Patient is using less depends - down to 1/day and 1/night.  Pt needed cues to keep spine neutral during the exercise progressions.  He is able to do more challenging exercises today More challenged with hips in internal rotation.  Pt will benefit from skilled PT to continue to progress core strength for reduced leakage ?  ?  ?OBJECTIVE IMPAIRMENTS decreased coordination, decreased endurance, decreased strength, impaired flexibility, impaired tone, and pain.  ?  ?ACTIVITY LIMITATIONS community activity.  ?  ?PERSONAL FACTORS 1-2 comorbidities: prostate cancer and prostatectomy  are also affecting patient's functional outcome.  ?  ?  ?REHAB POTENTIAL: Excellent ?  ?CLINICAL DECISION MAKING: Stable/uncomplicated ?  ?EVALUATION COMPLEXITY: Low ?  ?  ?GOALS: ?Goals reviewed with patient? Yes ?  ?SHORT TERM GOALS: ?  ?STG Name Target Date Goal status  ?  ?LTG Name Target Date Goal status  ?1 Pt will be independent with advanced HEP to maintain improvements made throughout therapy ?  ?Baseline: added to HEP (2032/07/01) 08/11/2021 ongoing  ?2 Pt will be able to functional actions such as getting up from low chair without leakage  ?Baseline: has a little bit not every time (07-01-21) 08/11/2021 ongoing  ?3 Pt will be able to run/workout for 30 minutes without leakage or discomfort  ?Baseline: 08/11/2021 ongoing  ?4 Pt will be able to lift at least 10 lb correctly for 10 reps without pain or leakage for functional activities   ?Baseline: 15 lb dead lifts needing cues 07-01-2021 08/11/2021 ongoing  ?5        ?6        ?7        ?  ?PLAN: ?PT FREQUENCY: 1x/week ?  ?PT DURATION: 12 weeks ?  ?PLANNED INTERVENTIONS: Therapeutic exercises, Therapeutic activity, Balance training, Gait training, Patient/Family education, Joint mobilization, Dry Needling, Electrical stimulation, Cryotherapy, Moist heat, Biofeedback, Manual therapy, and Neuro Muscular re-education ?  ?PLAN FOR NEXT SESSION: f/u on hip hinge; cont h/s and lumbar flexibility, core strength quadruped primal push  up and half kneeling progressions, lunges ?  ? ? ? ? ? ? ?Jule Ser, PT ?06/12/2021, 7:58 AM ? ?   ?

## 2021-06-26 ENCOUNTER — Other Ambulatory Visit (HOSPITAL_COMMUNITY): Payer: Self-pay

## 2021-07-11 ENCOUNTER — Other Ambulatory Visit (HOSPITAL_COMMUNITY): Payer: Self-pay

## 2021-07-13 ENCOUNTER — Other Ambulatory Visit (HOSPITAL_COMMUNITY): Payer: Self-pay

## 2021-07-14 ENCOUNTER — Other Ambulatory Visit: Payer: Self-pay | Admitting: Family Medicine

## 2021-07-15 ENCOUNTER — Other Ambulatory Visit (HOSPITAL_COMMUNITY): Payer: Self-pay

## 2021-07-15 MED ORDER — ZOLPIDEM TARTRATE 10 MG PO TABS
10.0000 mg | ORAL_TABLET | Freq: Every evening | ORAL | 1 refills | Status: DC | PRN
  Filled 2021-07-15: qty 30, 30d supply, fill #0
  Filled 2021-08-23: qty 30, 30d supply, fill #1

## 2021-07-25 ENCOUNTER — Other Ambulatory Visit (HOSPITAL_COMMUNITY): Payer: Self-pay

## 2021-07-29 DIAGNOSIS — C61 Malignant neoplasm of prostate: Secondary | ICD-10-CM | POA: Diagnosis not present

## 2021-08-11 DIAGNOSIS — C61 Malignant neoplasm of prostate: Secondary | ICD-10-CM | POA: Diagnosis not present

## 2021-08-24 ENCOUNTER — Other Ambulatory Visit (HOSPITAL_COMMUNITY): Payer: Self-pay

## 2021-08-31 ENCOUNTER — Encounter: Payer: Self-pay | Admitting: Family Medicine

## 2021-08-31 ENCOUNTER — Other Ambulatory Visit (HOSPITAL_COMMUNITY): Payer: Self-pay

## 2021-08-31 ENCOUNTER — Other Ambulatory Visit: Payer: Self-pay | Admitting: Physician Assistant

## 2021-08-31 DIAGNOSIS — C61 Malignant neoplasm of prostate: Secondary | ICD-10-CM | POA: Diagnosis not present

## 2021-08-31 MED ORDER — ZOLPIDEM TARTRATE 10 MG PO TABS
10.0000 mg | ORAL_TABLET | Freq: Every evening | ORAL | 0 refills | Status: DC | PRN
  Filled 2021-08-31: qty 30, 30d supply, fill #0

## 2021-08-31 MED ORDER — SILDENAFIL CITRATE 100 MG PO TABS
50.0000 mg | ORAL_TABLET | Freq: Every day | ORAL | 11 refills | Status: DC | PRN
  Filled 2021-08-31: qty 5, 30d supply, fill #0

## 2021-09-07 ENCOUNTER — Other Ambulatory Visit (HOSPITAL_COMMUNITY): Payer: Self-pay

## 2021-10-02 ENCOUNTER — Other Ambulatory Visit: Payer: Self-pay | Admitting: Physician Assistant

## 2021-10-02 ENCOUNTER — Other Ambulatory Visit (HOSPITAL_COMMUNITY): Payer: Self-pay

## 2021-10-02 MED ORDER — ZOLPIDEM TARTRATE 10 MG PO TABS
10.0000 mg | ORAL_TABLET | Freq: Every evening | ORAL | 0 refills | Status: DC | PRN
  Filled 2021-10-02: qty 30, 30d supply, fill #0

## 2021-10-02 NOTE — Telephone Encounter (Signed)
Pt requesting refill for Zolpidem 10 mg. Last OV 04/2021.

## 2021-11-04 ENCOUNTER — Other Ambulatory Visit: Payer: Self-pay | Admitting: Family Medicine

## 2021-11-04 ENCOUNTER — Other Ambulatory Visit (HOSPITAL_COMMUNITY): Payer: Self-pay

## 2021-11-04 MED ORDER — ZOLPIDEM TARTRATE 10 MG PO TABS
10.0000 mg | ORAL_TABLET | Freq: Every evening | ORAL | 5 refills | Status: DC | PRN
  Filled 2021-11-04: qty 30, 30d supply, fill #0
  Filled 2021-12-07: qty 30, 30d supply, fill #1
  Filled 2022-01-17: qty 30, 30d supply, fill #2
  Filled 2022-02-26: qty 30, 30d supply, fill #3
  Filled 2022-03-18 – 2022-04-04 (×5): qty 30, 30d supply, fill #4
  Filled 2022-04-29: qty 30, 30d supply, fill #5

## 2021-11-10 ENCOUNTER — Other Ambulatory Visit (HOSPITAL_COMMUNITY): Payer: Self-pay

## 2021-11-10 MED ORDER — TADALAFIL 20 MG PO TABS
ORAL_TABLET | ORAL | 0 refills | Status: DC
Start: 2021-11-10 — End: 2022-02-02
  Filled 2021-11-10: qty 36, 90d supply, fill #0

## 2021-11-11 ENCOUNTER — Other Ambulatory Visit (HOSPITAL_COMMUNITY): Payer: Self-pay

## 2021-12-07 ENCOUNTER — Other Ambulatory Visit: Payer: Self-pay | Admitting: Family Medicine

## 2021-12-07 ENCOUNTER — Other Ambulatory Visit (HOSPITAL_COMMUNITY): Payer: Self-pay

## 2021-12-07 MED ORDER — OLMESARTAN MEDOXOMIL-HCTZ 40-12.5 MG PO TABS
1.0000 | ORAL_TABLET | Freq: Every day | ORAL | 3 refills | Status: DC
  Filled 2021-12-07: qty 90, 90d supply, fill #0
  Filled 2022-03-18: qty 90, 90d supply, fill #1
  Filled 2022-06-17: qty 90, 90d supply, fill #2
  Filled 2022-09-14: qty 90, 90d supply, fill #3

## 2021-12-08 ENCOUNTER — Other Ambulatory Visit (HOSPITAL_COMMUNITY): Payer: Self-pay

## 2021-12-14 ENCOUNTER — Encounter: Payer: Self-pay | Admitting: *Deleted

## 2021-12-26 ENCOUNTER — Other Ambulatory Visit (HOSPITAL_COMMUNITY): Payer: Self-pay

## 2022-01-18 ENCOUNTER — Other Ambulatory Visit (HOSPITAL_COMMUNITY): Payer: Self-pay

## 2022-01-20 ENCOUNTER — Other Ambulatory Visit (HOSPITAL_BASED_OUTPATIENT_CLINIC_OR_DEPARTMENT_OTHER): Payer: Self-pay

## 2022-01-20 MED ORDER — COVID-19 MRNA VAC-TRIS(PFIZER) 30 MCG/0.3ML IM SUSY
0.3000 mL | PREFILLED_SYRINGE | Freq: Once | INTRAMUSCULAR | 0 refills | Status: AC
  Filled 2022-01-20: qty 0.3, 1d supply, fill #0

## 2022-01-26 ENCOUNTER — Other Ambulatory Visit (HOSPITAL_BASED_OUTPATIENT_CLINIC_OR_DEPARTMENT_OTHER): Payer: Self-pay

## 2022-01-28 DIAGNOSIS — C61 Malignant neoplasm of prostate: Secondary | ICD-10-CM | POA: Diagnosis not present

## 2022-02-02 ENCOUNTER — Other Ambulatory Visit (HOSPITAL_COMMUNITY): Payer: Self-pay

## 2022-02-02 MED ORDER — TADALAFIL 20 MG PO TABS
ORAL_TABLET | ORAL | 0 refills | Status: DC
  Filled 2022-02-02: qty 36, 84d supply, fill #0

## 2022-02-16 DIAGNOSIS — Z9079 Acquired absence of other genital organ(s): Secondary | ICD-10-CM | POA: Diagnosis not present

## 2022-02-16 DIAGNOSIS — R972 Elevated prostate specific antigen [PSA]: Secondary | ICD-10-CM | POA: Diagnosis not present

## 2022-02-16 DIAGNOSIS — C61 Malignant neoplasm of prostate: Secondary | ICD-10-CM | POA: Diagnosis not present

## 2022-02-26 ENCOUNTER — Other Ambulatory Visit (HOSPITAL_COMMUNITY): Payer: Self-pay

## 2022-03-04 ENCOUNTER — Encounter: Payer: Self-pay | Admitting: *Deleted

## 2022-03-18 ENCOUNTER — Other Ambulatory Visit (HOSPITAL_COMMUNITY): Payer: Self-pay

## 2022-03-18 ENCOUNTER — Other Ambulatory Visit: Payer: Self-pay

## 2022-03-23 ENCOUNTER — Other Ambulatory Visit: Payer: Self-pay

## 2022-03-23 ENCOUNTER — Other Ambulatory Visit (HOSPITAL_COMMUNITY): Payer: Self-pay

## 2022-03-23 MED ORDER — TADALAFIL 20 MG PO TABS
20.0000 mg | ORAL_TABLET | ORAL | 0 refills | Status: DC
  Filled 2022-03-23 – 2022-04-29 (×2): qty 36, 84d supply, fill #0

## 2022-03-24 ENCOUNTER — Other Ambulatory Visit (HOSPITAL_COMMUNITY): Payer: Self-pay

## 2022-03-26 ENCOUNTER — Other Ambulatory Visit (HOSPITAL_COMMUNITY): Payer: Self-pay

## 2022-04-05 ENCOUNTER — Other Ambulatory Visit (HOSPITAL_COMMUNITY): Payer: Self-pay

## 2022-04-05 ENCOUNTER — Other Ambulatory Visit: Payer: Self-pay

## 2022-04-29 ENCOUNTER — Other Ambulatory Visit (HOSPITAL_COMMUNITY): Payer: Self-pay

## 2022-04-29 ENCOUNTER — Other Ambulatory Visit: Payer: Self-pay

## 2022-05-05 ENCOUNTER — Institutional Professional Consult (permissible substitution): Payer: Commercial Managed Care - PPO | Admitting: Pulmonary Disease

## 2022-05-06 ENCOUNTER — Encounter: Payer: Self-pay | Admitting: Family Medicine

## 2022-05-06 ENCOUNTER — Ambulatory Visit (INDEPENDENT_AMBULATORY_CARE_PROVIDER_SITE_OTHER): Payer: Commercial Managed Care - PPO | Admitting: Family Medicine

## 2022-05-06 VITALS — BP 125/74 | HR 53 | Temp 97.7°F | Ht 69.0 in | Wt 217.0 lb

## 2022-05-06 DIAGNOSIS — Z1159 Encounter for screening for other viral diseases: Secondary | ICD-10-CM

## 2022-05-06 DIAGNOSIS — R739 Hyperglycemia, unspecified: Secondary | ICD-10-CM

## 2022-05-06 DIAGNOSIS — E785 Hyperlipidemia, unspecified: Secondary | ICD-10-CM

## 2022-05-06 DIAGNOSIS — C61 Malignant neoplasm of prostate: Secondary | ICD-10-CM | POA: Diagnosis not present

## 2022-05-06 DIAGNOSIS — I10 Essential (primary) hypertension: Secondary | ICD-10-CM | POA: Diagnosis not present

## 2022-05-06 DIAGNOSIS — G4700 Insomnia, unspecified: Secondary | ICD-10-CM | POA: Diagnosis not present

## 2022-05-06 DIAGNOSIS — Z114 Encounter for screening for human immunodeficiency virus [HIV]: Secondary | ICD-10-CM

## 2022-05-06 DIAGNOSIS — Z0001 Encounter for general adult medical examination with abnormal findings: Secondary | ICD-10-CM | POA: Diagnosis not present

## 2022-05-06 LAB — COMPREHENSIVE METABOLIC PANEL
ALT: 12 U/L (ref 0–53)
AST: 21 U/L (ref 0–37)
Albumin: 4.4 g/dL (ref 3.5–5.2)
Alkaline Phosphatase: 46 U/L (ref 39–117)
BUN: 20 mg/dL (ref 6–23)
CO2: 30 mEq/L (ref 19–32)
Calcium: 9.8 mg/dL (ref 8.4–10.5)
Chloride: 101 mEq/L (ref 96–112)
Creatinine, Ser: 1.42 mg/dL (ref 0.40–1.50)
GFR: 55.7 mL/min — ABNORMAL LOW (ref >60.00)
Glucose, Bld: 98 mg/dL (ref 70–99)
Potassium: 4.5 mEq/L (ref 3.5–5.1)
Sodium: 139 mEq/L (ref 135–145)
Total Bilirubin: 0.9 mg/dL (ref 0.2–1.2)
Total Protein: 7.2 g/dL (ref 6.0–8.3)

## 2022-05-06 LAB — LIPID PANEL
Cholesterol: 208 mg/dL — ABNORMAL HIGH (ref 0–200)
HDL: 65.1 mg/dL (ref >39.00)
LDL Cholesterol: 133 mg/dL — ABNORMAL HIGH (ref 0–99)
NonHDL: 143.24
Total CHOL/HDL Ratio: 3
Triglycerides: 49 mg/dL (ref 0.0–149.0)
VLDL: 9.8 mg/dL (ref 0.0–40.0)

## 2022-05-06 LAB — CBC
HCT: 41.4 % (ref 39.0–52.0)
Hemoglobin: 14 g/dL (ref 13.0–17.0)
MCHC: 33.8 g/dL (ref 30.0–36.0)
MCV: 91.8 fl (ref 78.0–100.0)
Platelets: 204 10*3/uL (ref 150.0–400.0)
RBC: 4.51 Mil/uL (ref 4.22–5.81)
RDW: 12.6 % (ref 11.5–15.5)
WBC: 3.6 10*3/uL — ABNORMAL LOW (ref 4.0–10.5)

## 2022-05-06 LAB — HEMOGLOBIN A1C: Hgb A1c MFr Bld: 5.6 % (ref 4.6–6.5)

## 2022-05-06 LAB — TSH: TSH: 1.76 u[IU]/mL (ref 0.35–5.50)

## 2022-05-06 NOTE — Assessment & Plan Note (Signed)
Overall stable.  Does not be improving.  Uses Ambien 10 mg nightly sporadically as needed especially when he has worsening stress and anxiety related to his recent prostate cancer diagnosis.  It is okay for him to continue for now but he will continue to wean down.

## 2022-05-06 NOTE — Patient Instructions (Signed)
It was very nice to see you today!  We will check blood work today.  Please continue to work on diet and exercise.  We will order a cardiac CT scan.  I will see you back in a year for your next physical.  Please come back to see me sooner if needed.  Take care, Dr Jerline Pain  PLEASE NOTE:  If you had any lab tests, please let us know if you have not heard back within a few days. You may see your results on mychart before we have a chance to review them but we will give you a call once they are reviewed by [place].   If we ordered any referrals today, please let us know if you have not heard from their office within the next week.   If you had any urgent prescriptions sent in today, please check with the pharmacy within an hour of our visit to make sure the prescription was transmitted appropriately.   Please try these tips to maintain a healthy lifestyle:  Eat at least 3 REAL meals and 1-2 snacks per day.  Aim for no more than 5 hours between eating.  If you eat breakfast, please do so within one hour of getting up.   Each meal should contain half fruits/vegetables, one quarter protein, and one quarter carbs (no bigger than a computer mouse)  Cut down on sweet beverages. This includes juice, soda, and sweet tea.   Drink at least 1 glass of water with each meal and aim for at least 8 glasses per day  Exercise at least 150 minutes every week.     Preventive Care 1-46 Years Old, Male Preventive care refers to lifestyle choices and visits with your health care provider that can promote health and wellness. Preventive care visits are also called wellness exams. What can I expect for my preventive care visit? Counseling During your preventive care visit, your health care provider may ask about your: Medical history, including: Past medical problems. Family medical history. Current health, including: Emotional well-being. Home life and relationship well-being. Sexual activity. Lifestyle,  including: Alcohol, nicotine or tobacco, and drug use. Access to firearms. Diet, exercise, and sleep habits. Safety issues such as seatbelt and bike helmet use. Sunscreen use. Work and work Statistician. Physical exam Your health care provider will check your: Height and weight. These may be used to calculate your BMI (body mass index). BMI is a measurement that tells if you are at a healthy weight. Waist circumference. This measures the distance around your waistline. This measurement also tells if you are at a healthy weight and may help predict your risk of certain diseases, such as type 2 diabetes and high blood pressure. Heart rate and blood pressure. Body temperature. Skin for abnormal spots. What immunizations do I need?  Vaccines are usually given at various ages, according to a schedule. Your health care provider will recommend vaccines for you based on your age, medical history, and lifestyle or other factors, such as travel or where you work. What tests do I need? Screening Your health care provider may recommend screening tests for certain conditions. This may include: Lipid and cholesterol levels. Diabetes screening. This is done by checking your blood sugar (glucose) after you have not eaten for a while (fasting). Hepatitis B test. Hepatitis C test. HIV (human immunodeficiency virus) test. STI (sexually transmitted infection) testing, if you are at risk. Lung cancer screening. Prostate cancer screening. Colorectal cancer screening. Talk with your health care provider about  your test results, treatment options, and if necessary, the need for more tests. Follow these instructions at home: Eating and drinking  Eat a diet that includes fresh fruits and vegetables, whole grains, lean protein, and low-fat dairy products. Take vitamin and mineral supplements as recommended by your health care provider. Do not drink alcohol if your health care provider tells you not to  drink. If you drink alcohol: Limit how much you have to 0-2 drinks a day. Know how much alcohol is in your drink. In the [place] one drink equals one 12 oz bottle of beer (355 mL), one 5 oz glass of wine (148 mL), or one 1 oz glass of hard liquor (44 mL). Lifestyle Brush your teeth every morning and night with fluoride toothpaste. Floss one time each day. Exercise for at least 30 minutes 5 or more days each week. Do not use any products that contain nicotine or tobacco. These products include cigarettes, chewing tobacco, and vaping devices, such as e-cigarettes. If you need help quitting, ask your health care provider. Do not use drugs. If you are sexually active, practice safe sex. Use a condom or other form of protection to prevent STIs. Take aspirin only as told by your health care provider. Make sure that you understand how much to take and what form to take. Work with your health care provider to find out whether it is safe and beneficial for you to take aspirin daily. Find healthy ways to manage stress, such as: Meditation, yoga, or listening to music. Journaling. Talking to a trusted person. Spending time with friends and family. Minimize exposure to UV radiation to reduce your risk of skin cancer. Safety Always wear your seat belt while driving or riding in a vehicle. Do not drive: If you have been drinking alcohol. Do not ride with someone who has been drinking. When you are tired or distracted. While texting. If you have been using any mind-altering substances or drugs. Wear a helmet and other protective equipment during sports activities. If you have firearms in your house, make sure you follow all gun safety procedures. What's next? Go to your health care provider once a year for an annual wellness visit. Ask your health care provider how often you should have your eyes and teeth checked. Stay up to date on all vaccines. This information is not intended to replace advice  given to you by your health care provider. Make sure you discuss any questions you have with your health care provider. Document Revised: 09/03/2020 Document Reviewed: 09/03/2020 Elsevier Patient Education  [employer].

## 2022-05-06 NOTE — Assessment & Plan Note (Signed)
Last PSAs have been undetectable.  Following with urology.  Still has a little bit of stress and anxiety for this but seems to be improving.  Still has some subsequent erectile dysfunction but he is managing conservatively for now.

## 2022-05-06 NOTE — Progress Notes (Signed)
Chief Complaint:  Michael Duffy is a 57 y.o. male who presents today for his annual comprehensive physical exam.    Assessment/Plan:  Chronic Problems Addressed Today: Prostate cancer ([place]) s/p prostatectomy 2023 Last PSAs have been undetectable.  Following with urology.  Still has a little bit of stress and anxiety for this but seems to be improving.  Still has some subsequent erectile dysfunction but he is managing conservatively for now.  Dyslipidemia Check lipids.  Check cardiac CT scan.  Hyperglycemia Check A1c.  Essential hypertension Blood pressure at goal today on olmesartan-HCTZ 40-12.5 once daily.   Insomnia Overall stable.  Does not be improving.  Uses Ambien 10 mg nightly sporadically as needed especially when he has worsening stress and anxiety related to his recent prostate cancer diagnosis.  It is okay for him to continue for now but he will continue to wean down.   Preventative Healthcare: Check labs.  Up-to-date on colon cancer screening.  Up-to-date on vaccines but he will check for his most recent tdap.  Will order cardiac CT scan.  Patient Counseling(The following topics were reviewed and/or handout was given):  -Nutrition: Stressed importance of moderation in sodium/caffeine intake, saturated fat and cholesterol, caloric balance, sufficient intake of fresh fruits, vegetables, and fiber.  -Stressed the importance of regular exercise.   -Substance Abuse: Discussed cessation/primary prevention of tobacco, alcohol, or other drug use; driving or other dangerous activities under the influence; availability of treatment for abuse.   -Injury prevention: Discussed safety belts, safety helmets, smoke detector, smoking near bedding or upholstery.   -Sexuality: Discussed sexually transmitted diseases, partner selection, use of condoms, avoidance of unintended pregnancy and contraceptive alternatives.   -Dental health: Discussed importance of regular tooth brushing,  flossing, and dental visits.  -Health maintenance and immunizations reviewed. Please refer to Health maintenance section.  Return to care in 1 year for next preventative visit.     Subjective:  HPI:  He has no acute complaints today.  See A/P for status of chronic conditions.  Lifestyle Diet: Balanced. Plenty of fruits and vegetables.  Exercise: Walks routinely. Plays pickleball.      05/06/2022    9:17 AM  Depression screen PHQ 2/9  Decreased Interest 0  Down, Depressed, Hopeless 0  PHQ - 2 Score 0    Health Maintenance Due  Topic Date Due   Hepatitis C Screening  Never done   DTaP/Tdap/Td (1 - Tdap) Never done     ROS: Per HPI, otherwise a complete review of systems was negative.   PMH:  The following were reviewed and entered/updated in epic: Past Medical History:  Diagnosis Date   BPH (benign prostatic hyperplasia)    Hypertension    Patient Active Problem List   Diagnosis Date Noted   Insomnia 05/14/2021   Prostate cancer [facility]) s/p prostatectomy 2023 05/14/2021   Dyslipidemia 12/11/2019   Hyperglycemia 12/11/2019   Essential hypertension 12/10/2019   Kidney stones 12/10/2019   BPH (benign prostatic hyperplasia) 12/10/2019   Back pain, lumbosacral 12/10/2019   Hidradenitis 12/10/2019   Past Surgical History:  Procedure Laterality Date   COLONOSCOPY  03/10/2020   Wilfrid Lund III, MD   LIPOMA EXCISION      Family History  Problem Relation Age of Onset   Hypertension Mother    Hypertension Sister    Colon cancer Neg Hx    Colon polyps Neg Hx    Esophageal cancer Neg Hx    Rectal cancer Neg Hx    Stomach  cancer Neg Hx     Medications- reviewed and updated Current Outpatient Medications  Medication Sig Dispense Refill   olmesartan-hydrochlorothiazide (BENICAR HCT) 40-12.5 MG tablet Take 1 tablet by mouth daily. 90 tablet 3   tadalafil (CIALIS) 20 MG tablet Take 1 tablet (20 mg total) by mouth 3 (three) times a week on Monday, Wednesday, and  Friday. Please take at bedside. 36 tablet 0   zolpidem (AMBIEN) 10 MG tablet Take 1 tablet by mouth at bedtime as needed for sleep. 30 tablet 5   No current facility-administered medications for this visit.    Allergies-reviewed and updated No Known Allergies  Social History   Socioeconomic History   Marital status: Married    Spouse name: Not on file   Number of children: Not on file   Years of education: Not on file   Highest education level: Not on file  Occupational History   Not on file  Tobacco Use   Smoking status: Never   Smokeless tobacco: Never  Vaping Use   Vaping Use: Never used  Substance and Sexual Activity   Alcohol use: Not Currently    Comment: one or twice per year    Drug use: Never   Sexual activity: Yes  Other Topics Concern   Not on file  Social History Narrative   Not on file   Social Determinants of Health   Financial Resource Strain: Not on file  Food Insecurity: Not on file  Transportation Needs: Not on file  Physical Activity: Not on file  Stress: Not on file  Social Connections: Not on file        Objective:  Physical Exam: BP 125/74   Pulse (!) 53   Temp 97.7 F (36.5 C) (Temporal)   Ht 5' 9"$  (1.753 m)   Wt 217 lb (98.4 kg)   SpO2 100%   BMI 32.05 kg/m   Body mass index is 32.05 kg/m. Wt Readings from Last 3 Encounters:  05/06/22 217 lb (98.4 kg)  12/10/20 229 lb 9.6 oz (104.1 kg)  03/10/20 233 lb (105.7 kg)   Gen: NAD, resting comfortably HEENT: TMs normal bilaterally. OP clear. No thyromegaly noted.  CV: RRR with no murmurs appreciated Pulm: NWOB, CTAB with no crackles, wheezes, or rhonchi GI: Normal bowel sounds present. Soft, Nontender, Nondistended. MSK: no edema, cyanosis, or clubbing noted Skin: warm, dry Neuro: CN2-12 grossly intact. Strength 5/5 in upper and lower extremities. Reflexes symmetric and intact bilaterally.  Psych: Normal affect and thought content     Aveion Nguyen M. Jerline Pain, MD 05/06/2022 9:46 AM

## 2022-05-06 NOTE — Assessment & Plan Note (Signed)
Check lipids.  Check cardiac CT scan.

## 2022-05-06 NOTE — Assessment & Plan Note (Signed)
Blood pressure at goal today on olmesartan-HCTZ 40-12.5 once daily.

## 2022-05-06 NOTE — Assessment & Plan Note (Signed)
Check A1c. 

## 2022-05-07 LAB — HIV ANTIBODY (ROUTINE TESTING W REFLEX): HIV 1&2 Ab, 4th Generation: NONREACTIVE

## 2022-05-07 LAB — HEPATITIS C ANTIBODY: Hepatitis C Ab: NONREACTIVE

## 2022-06-09 ENCOUNTER — Ambulatory Visit (HOSPITAL_BASED_OUTPATIENT_CLINIC_OR_DEPARTMENT_OTHER): Payer: Commercial Managed Care - PPO

## 2022-06-17 ENCOUNTER — Other Ambulatory Visit: Payer: Self-pay

## 2022-06-17 ENCOUNTER — Other Ambulatory Visit (HOSPITAL_COMMUNITY): Payer: Self-pay

## 2022-06-24 ENCOUNTER — Ambulatory Visit (HOSPITAL_BASED_OUTPATIENT_CLINIC_OR_DEPARTMENT_OTHER)
Admission: RE | Admit: 2022-06-24 | Discharge: 2022-06-24 | Disposition: A | Discharge status: Home / Self Care | Payer: Commercial Managed Care - PPO | Source: Ambulatory Visit | Attending: Family Medicine | Admitting: Family Medicine

## 2022-06-24 DIAGNOSIS — E785 Hyperlipidemia, unspecified: Secondary | ICD-10-CM | POA: Insufficient documentation

## 2022-06-24 DIAGNOSIS — R739 Hyperglycemia, unspecified: Secondary | ICD-10-CM | POA: Insufficient documentation

## 2022-06-24 DIAGNOSIS — Z0001 Encounter for general adult medical examination with abnormal findings: Secondary | ICD-10-CM | POA: Insufficient documentation

## 2022-06-28 NOTE — Progress Notes (Signed)
Please inform patient of the following:  Great news! His cardiac CT scan showed a calcium score of 0.  We can repeat in 5-10 years.  Katina Degree. Jimmey Ralph, MD 06/28/2022 1:21 PM

## 2022-07-14 ENCOUNTER — Other Ambulatory Visit: Payer: Self-pay | Admitting: Family Medicine

## 2022-07-14 ENCOUNTER — Other Ambulatory Visit (HOSPITAL_COMMUNITY): Payer: Self-pay

## 2022-07-14 MED ORDER — ZOLPIDEM TARTRATE 10 MG PO TABS
10.0000 mg | ORAL_TABLET | Freq: Every evening | ORAL | 5 refills | Status: DC | PRN
  Filled 2022-07-14: qty 30, 30d supply, fill #0
  Filled 2022-09-14: qty 30, 30d supply, fill #1
  Filled 2022-11-17: qty 30, 30d supply, fill #2
  Filled 2023-01-03: qty 30, 30d supply, fill #3

## 2022-07-14 NOTE — Telephone Encounter (Signed)
Last refill: 11/04/21 #30, 5 Last OV: 05/06/22 dx. CPE

## 2022-07-15 ENCOUNTER — Other Ambulatory Visit (HOSPITAL_COMMUNITY): Payer: Self-pay

## 2022-07-19 ENCOUNTER — Other Ambulatory Visit (HOSPITAL_COMMUNITY): Payer: Self-pay

## 2022-07-19 MED ORDER — TADALAFIL 20 MG PO TABS
ORAL_TABLET | ORAL | 0 refills | Status: DC
  Filled 2022-07-19: qty 36, 84d supply, fill #0

## 2022-08-20 DIAGNOSIS — C61 Malignant neoplasm of prostate: Secondary | ICD-10-CM | POA: Diagnosis not present

## 2022-08-31 DIAGNOSIS — Z9079 Acquired absence of other genital organ(s): Secondary | ICD-10-CM | POA: Diagnosis not present

## 2022-08-31 DIAGNOSIS — C61 Malignant neoplasm of prostate: Secondary | ICD-10-CM | POA: Diagnosis not present

## 2022-08-31 DIAGNOSIS — N5231 Erectile dysfunction following radical prostatectomy: Secondary | ICD-10-CM | POA: Diagnosis not present

## 2022-09-15 ENCOUNTER — Other Ambulatory Visit: Payer: Self-pay

## 2022-10-08 ENCOUNTER — Other Ambulatory Visit (HOSPITAL_COMMUNITY): Payer: Self-pay

## 2022-10-11 ENCOUNTER — Other Ambulatory Visit (HOSPITAL_COMMUNITY): Payer: Self-pay

## 2022-10-11 MED ORDER — TADALAFIL 20 MG PO TABS
20.0000 mg | ORAL_TABLET | ORAL | 0 refills | Status: DC
  Filled 2022-10-11: qty 36, 84d supply, fill #0

## 2022-10-18 ENCOUNTER — Other Ambulatory Visit (HOSPITAL_COMMUNITY): Payer: Self-pay

## 2022-11-17 ENCOUNTER — Other Ambulatory Visit: Payer: Self-pay

## 2022-11-17 ENCOUNTER — Other Ambulatory Visit (HOSPITAL_COMMUNITY): Payer: Self-pay

## 2022-12-15 ENCOUNTER — Other Ambulatory Visit: Payer: Self-pay | Admitting: Family Medicine

## 2022-12-15 ENCOUNTER — Other Ambulatory Visit (HOSPITAL_COMMUNITY): Payer: Self-pay

## 2022-12-15 MED ORDER — OLMESARTAN MEDOXOMIL-HCTZ 40-12.5 MG PO TABS
1.0000 | ORAL_TABLET | Freq: Every day | ORAL | 3 refills | Status: DC
  Filled 2022-12-15: qty 90, 90d supply, fill #0
  Filled 2023-03-24: qty 90, 90d supply, fill #1
  Filled 2023-06-18: qty 90, 90d supply, fill #2
  Filled 2023-09-26: qty 90, 90d supply, fill #3

## 2022-12-16 ENCOUNTER — Other Ambulatory Visit (HOSPITAL_COMMUNITY): Payer: Self-pay

## 2022-12-17 ENCOUNTER — Other Ambulatory Visit (HOSPITAL_COMMUNITY): Payer: Self-pay

## 2022-12-22 ENCOUNTER — Other Ambulatory Visit (HOSPITAL_BASED_OUTPATIENT_CLINIC_OR_DEPARTMENT_OTHER): Payer: Self-pay

## 2022-12-22 ENCOUNTER — Other Ambulatory Visit (HOSPITAL_COMMUNITY): Payer: Self-pay

## 2022-12-22 MED ORDER — COVID-19 MRNA VAC-TRIS(PFIZER) 30 MCG/0.3ML IM SUSY
0.3000 mL | PREFILLED_SYRINGE | Freq: Once | INTRAMUSCULAR | 0 refills | Status: AC
  Filled 2022-12-22 (×2): qty 0.3, 1d supply, fill #0

## 2022-12-22 MED ORDER — INFLUENZA VIRUS VACC SPLIT PF (FLUZONE) 0.5 ML IM SUSY
0.5000 mL | PREFILLED_SYRINGE | Freq: Once | INTRAMUSCULAR | 0 refills | Status: AC
  Filled 2022-12-22 (×2): qty 0.5, 1d supply, fill #0

## 2023-01-03 ENCOUNTER — Other Ambulatory Visit: Payer: Self-pay

## 2023-01-03 ENCOUNTER — Other Ambulatory Visit (HOSPITAL_COMMUNITY): Payer: Self-pay

## 2023-01-08 ENCOUNTER — Other Ambulatory Visit (HOSPITAL_COMMUNITY): Payer: Self-pay

## 2023-01-08 MED ORDER — TADALAFIL 20 MG PO TABS
20.0000 mg | ORAL_TABLET | ORAL | 0 refills | Status: DC
  Filled 2023-01-08: qty 36, 84d supply, fill #0

## 2023-01-10 ENCOUNTER — Other Ambulatory Visit (HOSPITAL_COMMUNITY): Payer: Self-pay

## 2023-03-24 ENCOUNTER — Other Ambulatory Visit: Payer: Self-pay | Admitting: Family Medicine

## 2023-03-24 ENCOUNTER — Other Ambulatory Visit: Payer: Self-pay

## 2023-03-28 ENCOUNTER — Other Ambulatory Visit (HOSPITAL_COMMUNITY): Payer: Self-pay

## 2023-03-28 ENCOUNTER — Other Ambulatory Visit (HOSPITAL_BASED_OUTPATIENT_CLINIC_OR_DEPARTMENT_OTHER): Payer: Self-pay

## 2023-03-28 MED ORDER — ZOLPIDEM TARTRATE 10 MG PO TABS
10.0000 mg | ORAL_TABLET | Freq: Every evening | ORAL | 5 refills | Status: DC | PRN
Start: 2023-03-28 — End: 2023-07-26
  Filled 2023-03-28: qty 30, 30d supply, fill #0
  Filled 2023-05-14: qty 30, 30d supply, fill #1
  Filled 2023-06-18: qty 30, 30d supply, fill #2

## 2023-03-31 ENCOUNTER — Other Ambulatory Visit (HOSPITAL_COMMUNITY): Payer: Self-pay

## 2023-04-02 ENCOUNTER — Other Ambulatory Visit (HOSPITAL_COMMUNITY): Payer: Self-pay

## 2023-04-02 MED ORDER — TADALAFIL 20 MG PO TABS
ORAL_TABLET | ORAL | 0 refills | Status: DC
  Filled 2023-04-02: qty 36, 30d supply, fill #0

## 2023-05-09 ENCOUNTER — Encounter: Payer: Commercial Managed Care - PPO | Admitting: Family Medicine

## 2023-05-14 ENCOUNTER — Other Ambulatory Visit (HOSPITAL_COMMUNITY): Payer: Self-pay

## 2023-05-16 ENCOUNTER — Other Ambulatory Visit: Payer: Self-pay

## 2023-05-17 ENCOUNTER — Other Ambulatory Visit (HOSPITAL_COMMUNITY): Payer: Self-pay

## 2023-05-17 MED ORDER — TADALAFIL 20 MG PO TABS
ORAL_TABLET | ORAL | 0 refills | Status: DC
  Filled 2023-05-17: qty 36, 84d supply, fill #0

## 2023-06-20 ENCOUNTER — Other Ambulatory Visit: Payer: Self-pay

## 2023-07-01 ENCOUNTER — Telehealth: Admitting: Emergency Medicine

## 2023-07-01 ENCOUNTER — Other Ambulatory Visit (HOSPITAL_BASED_OUTPATIENT_CLINIC_OR_DEPARTMENT_OTHER): Payer: Self-pay

## 2023-07-01 DIAGNOSIS — T85848A Pain due to other internal prosthetic devices, implants and grafts, initial encounter: Secondary | ICD-10-CM

## 2023-07-01 MED ORDER — AMOXICILLIN 500 MG PO CAPS
500.0000 mg | ORAL_CAPSULE | Freq: Three times a day (TID) | ORAL | 0 refills | Status: AC
  Filled 2023-07-01: qty 21, 7d supply, fill #0

## 2023-07-01 NOTE — Progress Notes (Signed)
 E-Visit for Dental Pain  We are sorry that you are not feeling well.  Here is how we plan to help!  Based on what you have shared with me in the questionnaire, it sounds like you have a complication with your dental implant.  This could be a developing infection.  We will start you on antibiotics, but it is imperative to follow-up with your dentist as soon as they can see you.  Pain control options are limited to Tylenol and Ibuprofen for virtual visits.  Amoxicillin 500mg  3 times a day for 7 days  It is imperative that you see a dentist within 10 days of this eVisit to determine the cause of the dental pain and be sure it is adequately treated  A toothache or tooth pain is caused when the nerve in the root of a tooth or surrounding a tooth is irritated. Dental (tooth) infection, decay, injury, or loss of a tooth are the most common causes of dental pain. Pain may also occur after an extraction (tooth is pulled out). Pain sometimes originates from other areas and radiates to the jaw, thus appearing to be tooth pain.Bacteria growing inside your mouth can contribute to gum disease and dental decay, both of which can cause pain. A toothache occurs from inflammation of the central portion of the tooth called pulp. The pulp contains nerve endings that are very sensitive to pain. Inflammation to the pulp or pulpitis may be caused by dental cavities, trauma, and infection.    HOME CARE:   For toothaches: Over-the-counter pain medications such as acetaminophen or ibuprofen may be used. Take these as directed on the package while you arrange for a dental appointment. Avoid very cold or hot foods, because they may make the pain worse. You may get relief from biting on a cotton ball soaked in oil of cloves. You can get oil of cloves at most drug stores.  For jaw pain:  Aspirin may be helpful for problems in the joint of the jaw in adults. If pain happens every time you open your mouth widely, the  temporomandibular joint (TMJ) may be the source of the pain. Yawning or taking a large bite of food may worsen the pain. An appointment with your doctor or dentist will help you find the cause.     GET HELP RIGHT AWAY IF:  You have a high fever or chills If you have had a recent head or face injury and develop headache, light headedness, nausea, vomiting, or other symptoms that concern you after an injury to your face or mouth, you could have a more serious injury in addition to your dental injury. A facial rash associated with a toothache: This condition may improve with medication. Contact your doctor for them to decide what is appropriate. Any jaw pain occurring with chest pain: Although jaw pain is most commonly caused by dental disease, it is sometimes referred pain from other areas. People with heart disease, especially people who have had stents placed, people with diabetes, or those who have had heart surgery may have jaw pain as a symptom of heart attack or angina. If your jaw or tooth pain is associated with lightheadedness, sweating, or shortness of breath, you should see a doctor as soon as possible. Trouble swallowing or excessive pain or bleeding from gums: If you have a history of a weakened immune system, diabetes, or steroid use, you may be more susceptible to infections. Infections can often be more severe and extensive or caused by  unusual organisms. Dental and gum infections in people with these conditions may require more aggressive treatment. An abscess may need draining or IV antibiotics, for example.  MAKE SURE YOU   Understand these instructions. Will watch your condition. Will get help right away if you are not doing well or get worse.  Thank you for choosing an e-visit.  Your e-visit answers were reviewed by a board certified advanced clinical practitioner to complete your personal care plan. Depending upon the condition, your plan could have included both over the  counter or prescription medications.  Please review your pharmacy choice. Make sure the pharmacy is open so you can pick up prescription now. If there is a problem, you may contact your provider through [employer] and have the prescription routed to another pharmacy.  Your safety is important to [place]. If you have drug allergies check your prescription carefully.   For the next 24 hours you can use MyChart to ask questions about today's visit, request a non-urgent call back, or ask for a work or school excuse. You will get an email in the next two days asking about your experience. I hope that your e-visit has been valuable and will speed your recovery.  Approximately 5 minutes was used in reviewing the patient's chart, questionnaire, prescribing medications, and documentation.

## 2023-07-13 LAB — HEPATIC FUNCTION PANEL
ALT: 11 U/L (ref 10–40)
AST: 19 (ref 14–40)
Alkaline Phosphatase: 62 (ref 25–125)
Bilirubin, Total: 0.6

## 2023-07-13 LAB — BASIC METABOLIC PANEL WITH GFR
BUN: 24 — AB (ref 4–21)
CO2: 24 — AB (ref 13–22)
Chloride: 101 (ref 99–108)
Creatinine: 1.3 (ref 0.6–1.3)
Glucose: 95
Potassium: 4.6 meq/L (ref 3.5–5.1)
Sodium: 140 (ref 137–147)

## 2023-07-13 LAB — COMPREHENSIVE METABOLIC PANEL WITH GFR
Albumin: 4.3 (ref 3.5–5.0)
Calcium: 9.7 (ref 8.7–10.7)
Globulin: 2.8
eGFR: 68

## 2023-07-13 LAB — TSH: TSH: 2.28 (ref 0.41–5.90)

## 2023-07-13 LAB — LIPID PANEL
Cholesterol: 184 (ref 0–200)
HDL: 66 (ref 35–70)
LDL Cholesterol: 108
Triglycerides: 51 (ref 40–160)

## 2023-07-13 LAB — CBC AND DIFFERENTIAL
HCT: 42 (ref 41–53)
Hemoglobin: 13.6 (ref 13.5–17.5)
Neutrophils Absolute: 1.4
Platelets: 243 10*3/uL (ref 150–400)
WBC: 4.3

## 2023-07-13 LAB — CBC: RBC: 4.5 (ref 3.87–5.11)

## 2023-07-13 LAB — PSA: PSA: 0.1

## 2023-07-26 ENCOUNTER — Ambulatory Visit (INDEPENDENT_AMBULATORY_CARE_PROVIDER_SITE_OTHER): Admitting: Family Medicine

## 2023-07-26 ENCOUNTER — Other Ambulatory Visit (HOSPITAL_COMMUNITY): Payer: Self-pay

## 2023-07-26 VITALS — BP 137/75 | HR 48 | Temp 97.5°F | Ht 69.0 in | Wt 212.2 lb

## 2023-07-26 DIAGNOSIS — Z0001 Encounter for general adult medical examination with abnormal findings: Secondary | ICD-10-CM

## 2023-07-26 DIAGNOSIS — I10 Essential (primary) hypertension: Secondary | ICD-10-CM

## 2023-07-26 DIAGNOSIS — N200 Calculus of kidney: Secondary | ICD-10-CM

## 2023-07-26 DIAGNOSIS — Z23 Encounter for immunization: Secondary | ICD-10-CM

## 2023-07-26 DIAGNOSIS — R739 Hyperglycemia, unspecified: Secondary | ICD-10-CM | POA: Diagnosis not present

## 2023-07-26 DIAGNOSIS — N281 Cyst of kidney, acquired: Secondary | ICD-10-CM

## 2023-07-26 DIAGNOSIS — Z0000 Encounter for general adult medical examination without abnormal findings: Secondary | ICD-10-CM | POA: Diagnosis not present

## 2023-07-26 DIAGNOSIS — C61 Malignant neoplasm of prostate: Secondary | ICD-10-CM | POA: Diagnosis not present

## 2023-07-26 DIAGNOSIS — E785 Hyperlipidemia, unspecified: Secondary | ICD-10-CM | POA: Diagnosis not present

## 2023-07-26 DIAGNOSIS — G4700 Insomnia, unspecified: Secondary | ICD-10-CM

## 2023-07-26 MED ORDER — ZOLPIDEM TARTRATE 10 MG PO TABS
10.0000 mg | ORAL_TABLET | Freq: Every evening | ORAL | 5 refills | Status: DC | PRN
  Filled 2023-07-26: qty 30, 30d supply, fill #0
  Filled 2023-09-26: qty 30, 30d supply, fill #1
  Filled 2023-11-12: qty 30, 30d supply, fill #2
  Filled 2023-12-25: qty 30, 30d supply, fill #3

## 2023-07-26 NOTE — Assessment & Plan Note (Signed)
 Following with urology at [facility].  Still having some subsequent erectile dysfunction but this is manageable.  His last PSAs have been undetectable.

## 2023-07-26 NOTE — Patient Instructions (Signed)
 It was very nice to see you today!  We will give you your Tdap and Prevnar vaccines today.  I will refill your Ambien .  Will order an ultrasound for the cyst in your kidney.  Please send us  a copy of your labs when you have them.  Will see you back in a year for your next physical.  Come back sooner if needed.  Return in about 1 year (around 07/25/2024) for Annual Physical.   Take care, Dr Daneil Dunker  PLEASE NOTE:  If you had any lab tests, please let us  know if you have not heard back within a few days. You may see your results on mychart before we have a chance to review them but we will give you a call once they are reviewed by us .   If we ordered any referrals today, please let us  know if you have not heard from their office within the next week.   If you had any urgent prescriptions sent in today, please check with the pharmacy within an hour of our visit to make sure the prescription was transmitted appropriately.   Please try these tips to maintain a healthy lifestyle:  Eat at least 3 REAL meals and 1-2 snacks per day.  Aim for no more than 5 hours between eating.  If you eat breakfast, please do so within one hour of getting up.   Each meal should contain half fruits/vegetables, one quarter protein, and one quarter carbs (no bigger than a computer mouse)  Cut down on sweet beverages. This includes juice, soda, and sweet tea.   Drink at least 1 glass of water with each meal and aim for at least 8 glasses per day  Exercise at least 150 minutes every week.    Preventive Care 70-64 Years Old, Male Preventive care refers to lifestyle choices and visits with your health care provider that can promote health and wellness. Preventive care visits are also called wellness exams. What can I expect for my preventive care visit? Counseling During your preventive care visit, your health care provider may ask about your: Medical history, including: Past medical problems. Family medical  history. Current health, including: Emotional well-being. Home life and relationship well-being. Sexual activity. Lifestyle, including: Alcohol, nicotine or tobacco, and drug use. Access to firearms. Diet, exercise, and sleep habits. Safety issues such as seatbelt and bike helmet use. Sunscreen use. Work and work Astronomer. Physical exam Your health care provider will check your: Height and weight. These may be used to calculate your BMI (body mass index). BMI is a measurement that tells if you are at a healthy weight. Waist circumference. This measures the distance around your waistline. This measurement also tells if you are at a healthy weight and may help predict your risk of certain diseases, such as type 2 diabetes and high blood pressure. Heart rate and blood pressure. Body temperature. Skin for abnormal spots. What immunizations do I need?  Vaccines are usually given at various ages, according to a schedule. Your health care provider will recommend vaccines for you based on your age, medical history, and lifestyle or other factors, such as travel or where you work. What tests do I need? Screening Your health care provider may recommend screening tests for certain conditions. This may include: Lipid and cholesterol levels. Diabetes screening. This is done by checking your blood sugar (glucose) after you have not eaten for a while (fasting). Hepatitis B test. Hepatitis C test. HIV (human immunodeficiency virus) test. STI (sexually transmitted  infection) testing, if you are at risk. Lung cancer screening. Prostate cancer screening. Colorectal cancer screening. Talk with your health care provider about your test results, treatment options, and if necessary, the need for more tests. Follow these instructions at home: Eating and drinking  Eat a diet that includes fresh fruits and vegetables, whole grains, lean protein, and low-fat dairy products. Take vitamin and mineral  supplements as recommended by your health care provider. Do not drink alcohol if your health care provider tells you not to drink. If you drink alcohol: Limit how much you have to 0-2 drinks a day. Know how much alcohol is in your drink. In the [place] one drink equals one 12 oz bottle of beer (355 mL), one 5 oz glass of wine (148 mL), or one 1 oz glass of hard liquor (44 mL). Lifestyle Brush your teeth every morning and night with fluoride toothpaste. Floss one time each day. Exercise for at least 30 minutes 5 or more days each week. Do not use any products that contain nicotine or tobacco. These products include cigarettes, chewing tobacco, and vaping devices, such as e-cigarettes. If you need help quitting, ask your health care provider. Do not use drugs. If you are sexually active, practice safe sex. Use a condom or other form of protection to prevent STIs. Take aspirin only as told by your health care provider. Make sure that you understand how much to take and what form to take. Work with your health care provider to find out whether it is safe and beneficial for you to take aspirin daily. Find healthy ways to manage stress, such as: Meditation, yoga, or listening to music. Journaling. Talking to a trusted person. Spending time with friends and family. Minimize exposure to UV radiation to reduce your risk of skin cancer. Safety Always wear your seat belt while driving or riding in a vehicle. Do not drive: If you have been drinking alcohol. Do not ride with someone who has been drinking. When you are tired or distracted. While texting. If you have been using any mind-altering substances or drugs. Wear a helmet and other protective equipment during sports activities. If you have firearms in your house, make sure you follow all gun safety procedures. What's next? Go to your health care provider once a year for an annual wellness visit. Ask your health care provider how often you should  have your eyes and teeth checked. Stay up to date on all vaccines. This information is not intended to replace advice given to you by your health care provider. Make sure you discuss any questions you have with your health care provider. Document Revised: 09/03/2020 Document Reviewed: 09/03/2020 Elsevier Patient Education  2024 [employer].

## 2023-07-26 NOTE — Assessment & Plan Note (Signed)
Stable on Ambien 10 mg nightly as needed.  No significant side effects.

## 2023-07-26 NOTE — Assessment & Plan Note (Signed)
 Discussed lifestyle modifications.  He will send us  a copy of his most recent lipid panel soon.

## 2023-07-26 NOTE — Assessment & Plan Note (Signed)
 Is working with diet and exercise.  Had A1c drawn with his most recent labs.

## 2023-07-26 NOTE — Assessment & Plan Note (Signed)
 Was noted to have a renal cyst few years ago on CT scan.  We performed cardiac CT scan last year which did show potential concern for renal cyst as well.  Will check ultrasound to further characterize.

## 2023-07-26 NOTE — Progress Notes (Signed)
 Chief Complaint:  Michael Duffy is a 58 y.o. male who presents today for his annual comprehensive physical exam.    Assessment/Plan:  Chronic Problems Addressed Today: Essential hypertension Blood pressure at goal today on olmesartan  HCTZ 40-12.5 once daily.  Dyslipidemia Discussed lifestyle modifications.  He will send us  a copy of his most recent lipid panel soon.  Hyperglycemia Is working with diet and exercise.  Had A1c drawn with his most recent labs.  Insomnia Stable on Ambien  10 mg nightly as needed.  No significant side effects.  Prostate cancer [facility]) s/p prostatectomy 2023 Following with urology at [facility].  Still having some subsequent erectile dysfunction but this is manageable.  His last PSAs have been undetectable.  Kidney stones Was noted to have a renal cyst few years ago on CT scan.  We performed cardiac CT scan last year which did show potential concern for renal cyst as well.  Will check ultrasound to further characterize.   Preventative Healthcare: Tdap and Prevnar given today.  Had labs done a few weeks ago-he will get us  a copy of these once he has them available.  Up-to-date on colon cancer screening.  Patient Counseling(The following topics were reviewed and/or handout was given):  -Nutrition: Stressed importance of moderation in sodium/caffeine intake, saturated fat and cholesterol, caloric balance, sufficient intake of fresh fruits, vegetables, and fiber.  -Stressed the importance of regular exercise.   -Substance Abuse: Discussed cessation/primary prevention of tobacco, alcohol, or other drug use; driving or other dangerous activities under the influence; availability of treatment for abuse.   -Injury prevention: Discussed safety belts, safety helmets, smoke detector, smoking near bedding or upholstery.   -Sexuality: Discussed sexually transmitted diseases, partner selection, use of condoms, avoidance of unintended pregnancy and contraceptive  alternatives.   -Dental health: Discussed importance of regular tooth brushing, flossing, and dental visits.  -Health maintenance and immunizations reviewed. Please refer to Health maintenance section.  Return to care in 1 year for next preventative visit.     Subjective:  HPI:  He has no acute complaints today.  See A/P for status of chronic conditions.  Lifestyle Diet: Balanced. Trying to get more fruits and vegetables.  Exercise: Working on treadmill 3 times weekly.      07/26/2023    7:23 AM  Depression screen PHQ 2/9  Decreased Interest 0  Down, Depressed, Hopeless 0  PHQ - 2 Score 0    There are no preventive care reminders to display for this patient.    ROS: Per HPI, otherwise a complete review of systems was negative.   PMH:  The following were reviewed and entered/updated in epic: Past Medical History:  Diagnosis Date   BPH (benign prostatic hyperplasia)    Hypertension    Patient Active Problem List   Diagnosis Date Noted   Insomnia 05/14/2021   Prostate cancer [facility]) s/p prostatectomy 2023 05/14/2021   Dyslipidemia 12/11/2019   Hyperglycemia 12/11/2019   Essential hypertension 12/10/2019   Kidney stones 12/10/2019   BPH (benign prostatic hyperplasia) 12/10/2019   Back pain, lumbosacral 12/10/2019   Hidradenitis 12/10/2019   Past Surgical History:  Procedure Laterality Date   COLONOSCOPY  03/10/2020   Lorella Roles III, MD   LIPOMA EXCISION      Family History  Problem Relation Age of Onset   Hypertension Mother    Hypertension Sister    Colon cancer Neg Hx    Colon polyps Neg Hx    Esophageal cancer Neg Hx    Rectal  cancer Neg Hx    Stomach cancer Neg Hx     Medications- reviewed and updated Current Outpatient Medications  Medication Sig Dispense Refill   olmesartan -hydrochlorothiazide  (BENICAR  HCT) 40-12.5 MG tablet Take 1 tablet by mouth daily. 90 tablet 3   tadalafil  (CIALIS ) 20 MG tablet Take 1 tablet (20 mg total) by mouth 3 (three)  times a week on monday, wednesday and fridays. please take at bedside. 36 tablet 0   zolpidem  (AMBIEN ) 10 MG tablet Take 1 tablet by mouth at bedtime as needed for sleep. 30 tablet 5   No current facility-administered medications for this visit.    Allergies-reviewed and updated No Known Allergies  Social History   Socioeconomic History   Marital status: Married    Spouse name: Not on file   Number of children: Not on file   Years of education: Not on file   Highest education level: Professional school degree (e.g., MD, DDS, DVM, JD)  Occupational History   Not on file  Tobacco Use   Smoking status: Never   Smokeless tobacco: Never  Vaping Use   Vaping status: Never Used  Substance and Sexual Activity   Alcohol use: Not Currently    Comment: one or twice per year    Drug use: Never   Sexual activity: Yes  Other Topics Concern   Not on file  Social History Narrative   Not on file   Social Drivers of Health   Financial Resource Strain: Low Risk  (07/26/2023)   Overall Financial Resource Strain (CARDIA)    Difficulty of Paying Living Expenses: Not hard at all  Food Insecurity: No Food Insecurity (07/26/2023)   Hunger Vital Sign    Worried About Running Out of Food in the Last Year: Never true    Ran Out of Food in the Last Year: Never true  Transportation Needs: No Transportation Needs (07/26/2023)   PRAPARE - Administrator, Civil Service (Medical): No    Lack of Transportation (Non-Medical): No  Physical Activity: Sufficiently Active (07/26/2023)   Exercise Vital Sign    Days of Exercise per Week: 5 days    Minutes of Exercise per Session: 30 min  Stress: No Stress Concern Present (07/26/2023)   [employer] of Occupational Health - Occupational Stress Questionnaire    Feeling of Stress : Not at all  Social Connections: Moderately Isolated (07/26/2023)   Social Connection and Isolation Panel [NHANES]    Frequency of Communication with Friends and Family:  More than three times a week    Frequency of Social Gatherings with Friends and Family: Once a week    Attends Religious Services: Never    Database administrator or Organizations: No    Attends Engineer, structural: Not on file    Marital Status: Married        Objective:  Physical Exam: BP 137/75   Pulse (!) 48   Temp (!) 97.5 F (36.4 C) (Temporal)   Ht 5\' 9"  (1.753 m)   Wt 212 lb 3.2 oz (96.3 kg)   SpO2 99%   BMI 31.34 kg/m   Body mass index is 31.34 kg/m. Wt Readings from Last 3 Encounters:  07/26/23 212 lb 3.2 oz (96.3 kg)  05/06/22 217 lb (98.4 kg)  12/10/20 229 lb 9.6 oz (104.1 kg)   Gen: NAD, resting comfortably HEENT: TMs normal bilaterally. OP clear. No thyromegaly noted.  CV: RRR with no murmurs appreciated Pulm: NWOB, CTAB with no crackles, wheezes,  or rhonchi GI: Normal bowel sounds present. Soft, Nontender, Nondistended. MSK: no edema, cyanosis, or clubbing noted Skin: warm, dry Neuro: CN2-12 grossly intact. Strength 5/5 in upper and lower extremities. Reflexes symmetric and intact bilaterally.  Psych: Normal affect and thought content     Jaynee Winters M. Daneil Dunker, MD 07/26/2023 8:01 AM

## 2023-07-26 NOTE — Assessment & Plan Note (Signed)
Blood pressure at goal today on olmesartan-HCTZ 40-12.5 once daily.  

## 2023-08-06 ENCOUNTER — Ambulatory Visit (HOSPITAL_BASED_OUTPATIENT_CLINIC_OR_DEPARTMENT_OTHER)
Admission: RE | Admit: 2023-08-06 | Discharge: 2023-08-06 | Disposition: A | Discharge status: Home / Self Care | Source: Ambulatory Visit | Attending: Family Medicine | Admitting: Family Medicine

## 2023-08-06 DIAGNOSIS — N281 Cyst of kidney, acquired: Secondary | ICD-10-CM | POA: Diagnosis not present

## 2023-08-08 ENCOUNTER — Encounter: Payer: Self-pay | Admitting: Family Medicine

## 2023-08-08 ENCOUNTER — Ambulatory Visit: Payer: Self-pay | Admitting: Family Medicine

## 2023-08-08 ENCOUNTER — Encounter: Payer: Commercial Managed Care - PPO | Admitting: Family Medicine

## 2023-08-08 NOTE — Progress Notes (Signed)
 His ultrasound confirmed multiple renal cysts in both of his kidneys with the largest in his left kidney at 3.2 cm.  I think further workup is probably warranted at this time.  Next step would be a contrast-enhanced MRI or we can refer him to see a urologist.

## 2023-08-11 ENCOUNTER — Other Ambulatory Visit: Payer: Self-pay | Admitting: *Deleted

## 2023-08-11 DIAGNOSIS — N281 Cyst of kidney, acquired: Secondary | ICD-10-CM

## 2023-08-23 DIAGNOSIS — Z8546 Personal history of malignant neoplasm of prostate: Secondary | ICD-10-CM | POA: Diagnosis not present

## 2023-08-23 DIAGNOSIS — N281 Cyst of kidney, acquired: Secondary | ICD-10-CM | POA: Diagnosis not present

## 2023-08-30 ENCOUNTER — Other Ambulatory Visit: Payer: Self-pay | Admitting: Urology

## 2023-08-30 DIAGNOSIS — N281 Cyst of kidney, acquired: Secondary | ICD-10-CM

## 2023-09-02 ENCOUNTER — Other Ambulatory Visit (HOSPITAL_COMMUNITY): Payer: Self-pay

## 2023-09-02 MED ORDER — DIAZEPAM 10 MG PO TABS
10.0000 mg | ORAL_TABLET | Freq: Every day | ORAL | 0 refills | Status: DC
  Filled 2023-09-02: qty 1, 1d supply, fill #0

## 2023-09-20 ENCOUNTER — Other Ambulatory Visit (HOSPITAL_COMMUNITY): Payer: Self-pay

## 2023-09-20 MED ORDER — AMOXICILLIN 500 MG PO CAPS
500.0000 mg | ORAL_CAPSULE | ORAL | 0 refills | Status: DC
  Filled 2023-09-20: qty 25, 10d supply, fill #0

## 2023-09-21 ENCOUNTER — Ambulatory Visit
Admission: RE | Admit: 2023-09-21 | Discharge: 2023-09-21 | Disposition: A | Discharge status: Home / Self Care | Source: Ambulatory Visit | Attending: Urology

## 2023-09-21 DIAGNOSIS — N281 Cyst of kidney, acquired: Secondary | ICD-10-CM | POA: Diagnosis not present

## 2023-09-21 MED ORDER — GADOPICLENOL 0.5 MMOL/ML IV SOLN
10.0000 mL | Freq: Once | INTRAVENOUS | Status: AC | PRN
  Administered 2023-09-21: 10 mL via INTRAVENOUS

## 2023-09-26 ENCOUNTER — Other Ambulatory Visit (HOSPITAL_COMMUNITY): Payer: Self-pay

## 2023-09-26 ENCOUNTER — Other Ambulatory Visit: Payer: Self-pay

## 2023-09-26 ENCOUNTER — Encounter: Payer: Self-pay | Admitting: Family Medicine

## 2023-09-26 DIAGNOSIS — I10 Essential (primary) hypertension: Secondary | ICD-10-CM

## 2023-09-26 DIAGNOSIS — C61 Malignant neoplasm of prostate: Secondary | ICD-10-CM

## 2023-09-26 NOTE — Telephone Encounter (Signed)
 Patient requesting CMP and PSA Blood work done  [facility] to placed order  (See previews note)

## 2023-09-26 NOTE — Telephone Encounter (Signed)
 See previews note

## 2023-09-26 NOTE — Telephone Encounter (Signed)
 Ok to place orders.  Worth HERO. Kennyth, MD 09/26/2023 7:51 AM

## 2023-09-28 ENCOUNTER — Other Ambulatory Visit (INDEPENDENT_AMBULATORY_CARE_PROVIDER_SITE_OTHER)

## 2023-09-28 DIAGNOSIS — C61 Malignant neoplasm of prostate: Secondary | ICD-10-CM

## 2023-09-28 DIAGNOSIS — I10 Essential (primary) hypertension: Secondary | ICD-10-CM

## 2023-09-28 LAB — COMPREHENSIVE METABOLIC PANEL WITH GFR
ALT: 14 U/L (ref 0–53)
AST: 20 U/L (ref 0–37)
Albumin: 4.4 g/dL (ref 3.5–5.2)
Alkaline Phosphatase: 62 U/L (ref 39–117)
BUN: 26 mg/dL — ABNORMAL HIGH (ref 6–23)
CO2: 34 meq/L — ABNORMAL HIGH (ref 19–32)
Calcium: 9.7 mg/dL (ref 8.4–10.5)
Chloride: 102 meq/L (ref 96–112)
Creatinine, Ser: 1.52 mg/dL — ABNORMAL HIGH (ref 0.40–1.50)
GFR: 50.83 mL/min — ABNORMAL LOW (ref >60.00)
Glucose, Bld: 102 mg/dL — ABNORMAL HIGH (ref 70–99)
Potassium: 4 meq/L (ref 3.5–5.1)
Sodium: 141 meq/L (ref 135–145)
Total Bilirubin: 1 mg/dL (ref 0.2–1.2)
Total Protein: 7.1 g/dL (ref 6.0–8.3)

## 2023-09-28 LAB — PSA: PSA: 0.03 ng/mL — ABNORMAL LOW (ref 0.10–4.00)

## 2023-09-30 ENCOUNTER — Ambulatory Visit: Payer: Self-pay | Admitting: Family Medicine

## 2023-09-30 NOTE — Progress Notes (Signed)
 PSA remains low.  Creatinine is up a little bit however seems to be better than the 1.8 that was checked at the urologist office.  Hopefully this will continue to trend down though we can recheck again in a few weeks if urology does not recheck it at your follow up.

## 2023-10-05 ENCOUNTER — Other Ambulatory Visit (HOSPITAL_COMMUNITY): Payer: Self-pay

## 2023-10-07 DIAGNOSIS — N281 Cyst of kidney, acquired: Secondary | ICD-10-CM | POA: Diagnosis not present

## 2023-10-07 DIAGNOSIS — N1831 Chronic kidney disease, stage 3a: Secondary | ICD-10-CM | POA: Diagnosis not present

## 2023-10-11 ENCOUNTER — Other Ambulatory Visit: Payer: Self-pay

## 2023-10-11 ENCOUNTER — Other Ambulatory Visit (HOSPITAL_COMMUNITY): Payer: Self-pay

## 2023-10-11 DIAGNOSIS — C61 Malignant neoplasm of prostate: Secondary | ICD-10-CM | POA: Diagnosis not present

## 2023-10-11 DIAGNOSIS — Z9079 Acquired absence of other genital organ(s): Secondary | ICD-10-CM | POA: Diagnosis not present

## 2023-10-11 DIAGNOSIS — N5231 Erectile dysfunction following radical prostatectomy: Secondary | ICD-10-CM | POA: Diagnosis not present

## 2023-10-11 DIAGNOSIS — N3281 Overactive bladder: Secondary | ICD-10-CM | POA: Diagnosis not present

## 2023-10-11 MED ORDER — SILDENAFIL CITRATE 100 MG PO TABS
100.0000 mg | ORAL_TABLET | ORAL | 10 refills | Status: AC
  Filled 2023-10-11: qty 5, 30d supply, fill #0
  Filled 2023-11-12: qty 5, 30d supply, fill #1
  Filled 2023-12-13: qty 5, 30d supply, fill #2
  Filled 2024-02-07: qty 5, 30d supply, fill #3
  Filled 2024-03-20: qty 5, 30d supply, fill #4

## 2023-10-11 MED ORDER — SOLIFENACIN SUCCINATE 10 MG PO TABS
10.0000 mg | ORAL_TABLET | Freq: Every day | ORAL | 3 refills | Status: AC
  Filled 2023-10-11: qty 90, 90d supply, fill #0
  Filled 2024-02-07: qty 90, 90d supply, fill #1

## 2023-10-11 MED ORDER — TADALAFIL 20 MG PO TABS
20.0000 mg | ORAL_TABLET | Freq: Every day | ORAL | 10 refills | Status: AC
  Filled 2023-10-11: qty 30, 30d supply, fill #0
  Filled 2023-11-12: qty 30, 30d supply, fill #1
  Filled 2023-12-13: qty 30, 30d supply, fill #2
  Filled 2024-02-07: qty 30, 30d supply, fill #3
  Filled 2024-03-20: qty 30, 30d supply, fill #4

## 2023-10-21 ENCOUNTER — Other Ambulatory Visit: Payer: Self-pay

## 2023-10-24 ENCOUNTER — Other Ambulatory Visit (HOSPITAL_COMMUNITY): Payer: Self-pay

## 2023-11-12 ENCOUNTER — Other Ambulatory Visit (HOSPITAL_COMMUNITY): Payer: Self-pay

## 2023-11-14 ENCOUNTER — Other Ambulatory Visit: Payer: Self-pay

## 2023-12-09 DIAGNOSIS — N1831 Chronic kidney disease, stage 3a: Secondary | ICD-10-CM | POA: Diagnosis not present

## 2023-12-09 DIAGNOSIS — N281 Cyst of kidney, acquired: Secondary | ICD-10-CM | POA: Diagnosis not present

## 2023-12-22 ENCOUNTER — Other Ambulatory Visit (HOSPITAL_COMMUNITY): Payer: Self-pay

## 2023-12-23 ENCOUNTER — Encounter: Payer: Self-pay | Admitting: Pharmacist

## 2023-12-23 ENCOUNTER — Other Ambulatory Visit: Payer: Self-pay

## 2023-12-25 ENCOUNTER — Other Ambulatory Visit: Payer: Self-pay | Admitting: Family Medicine

## 2023-12-25 ENCOUNTER — Other Ambulatory Visit (HOSPITAL_COMMUNITY): Payer: Self-pay

## 2023-12-26 ENCOUNTER — Other Ambulatory Visit: Payer: Self-pay

## 2023-12-26 MED ORDER — OLMESARTAN MEDOXOMIL-HCTZ 40-12.5 MG PO TABS
1.0000 | ORAL_TABLET | Freq: Every day | ORAL | 3 refills | Status: AC
  Filled 2023-12-26: qty 90, 90d supply, fill #0
  Filled 2024-03-20: qty 90, 90d supply, fill #1

## 2024-02-15 ENCOUNTER — Other Ambulatory Visit (HOSPITAL_COMMUNITY): Payer: Self-pay

## 2024-02-19 ENCOUNTER — Encounter: Payer: Self-pay | Admitting: Family Medicine

## 2024-02-20 ENCOUNTER — Other Ambulatory Visit: Payer: Self-pay

## 2024-02-20 ENCOUNTER — Other Ambulatory Visit (HOSPITAL_COMMUNITY): Payer: Self-pay

## 2024-02-20 DIAGNOSIS — I10 Essential (primary) hypertension: Secondary | ICD-10-CM

## 2024-02-20 MED ORDER — AMLODIPINE BESYLATE 5 MG PO TABS
5.0000 mg | ORAL_TABLET | Freq: Every day | ORAL | 3 refills | Status: AC
  Filled 2024-02-20: qty 90, 90d supply, fill #0

## 2024-02-20 NOTE — Telephone Encounter (Signed)
 Please advise on adding amlodipine. Please and thank you.

## 2024-02-20 NOTE — Telephone Encounter (Signed)
 Agree with adding on amlodipine 5 mg daily. Please send in new prescription and have him follow up with us  in a few weeks on mychart

## 2024-03-21 ENCOUNTER — Other Ambulatory Visit (HOSPITAL_COMMUNITY): Payer: Self-pay

## 2024-03-21 ENCOUNTER — Other Ambulatory Visit: Payer: Self-pay

## 2024-04-06 ENCOUNTER — Ambulatory Visit: Admitting: Family Medicine

## 2024-04-06 VITALS — BP 120/70 | HR 46 | Temp 97.8°F | Ht 69.0 in | Wt 222.4 lb

## 2024-04-06 DIAGNOSIS — I10 Essential (primary) hypertension: Secondary | ICD-10-CM

## 2024-04-06 LAB — COMPREHENSIVE METABOLIC PANEL WITH GFR
ALT: 12 U/L (ref 3–53)
AST: 13 U/L (ref 5–37)
Albumin: 4.4 g/dL (ref 3.5–5.2)
Alkaline Phosphatase: 63 U/L (ref 39–117)
BUN: 21 mg/dL (ref 6–23)
CO2: 33 meq/L — ABNORMAL HIGH (ref 19–32)
Calcium: 9.5 mg/dL (ref 8.4–10.5)
Chloride: 100 meq/L (ref 96–112)
Creatinine, Ser: 1.2 mg/dL (ref 0.40–1.50)
GFR: 67.26 mL/min
Glucose, Bld: 88 mg/dL (ref 70–99)
Potassium: 3.9 meq/L (ref 3.5–5.1)
Sodium: 138 meq/L (ref 135–145)
Total Bilirubin: 0.8 mg/dL (ref 0.2–1.2)
Total Protein: 7.6 g/dL (ref 6.0–8.3)

## 2024-04-06 NOTE — Progress Notes (Signed)
" ° °  Nishaan A Fina is a 59 y.o. male who presents today for an office visit.  Assessment/Plan:  Chronic Problems Addressed Today: Essential hypertension Patient started amlodipine  5 mg daily several weeks ago.  He has done very well with this and his home readings have been at goal as well.  Blood pressure is at goal today.  We will continue current regimen amlodipine  5 mg daily in addition to his olmesartan  HCTZ 40-12.5 once daily.  He will continue to monitor at home and let us  know if persistently elevated.  He did recently have slight elevation in his creatinine several months ago.  Will recheck labs today.  I will see him back in a few months for his physical.     Subjective:  HPI:  See assessment / plan for status of chronic conditions.  Patient is here today for blood pressure follow-up.  I last saw him about 9 months ago for his annual physical.  Since our last visit he was having some issues with blood pressure elevations at home and requested we start amlodipine .  He has been on this for the last month or so and has done well with this.  Home readings are typically in the 120s over 70s.  No significant side effects.       Objective:  Physical Exam: BP 120/70   Pulse (!) 46   Temp 97.8 F (36.6 C) (Temporal)   Ht 5' 9 (1.753 m)   Wt 222 lb 6.4 oz (100.9 kg)   SpO2 98%   BMI 32.84 kg/m   Gen: No acute distress, resting comfortably CV: Regular rate and rhythm with no murmurs appreciated Pulm: Normal work of breathing, clear to auscultation bilaterally with no crackles, wheezes, or rhonchi Neuro: Grossly normal, moves all extremities Psych: Normal affect and thought content      Nyomie Ehrlich M. Kennyth, MD 04/06/2024 1:15 PM  "

## 2024-04-06 NOTE — Assessment & Plan Note (Signed)
 Patient started amlodipine  5 mg daily several weeks ago.  He has done very well with this and his home readings have been at goal as well.  Blood pressure is at goal today.  We will continue current regimen amlodipine  5 mg daily in addition to his olmesartan  HCTZ 40-12.5 once daily.  He will continue to monitor at home and let us  know if persistently elevated.  He did recently have slight elevation in his creatinine several months ago.  Will recheck labs today.  I will see him back in a few months for his physical.

## 2024-04-10 ENCOUNTER — Ambulatory Visit: Payer: Self-pay | Admitting: Family Medicine

## 2024-04-10 NOTE — Progress Notes (Signed)
 Creatinine is much better.  We can recheck in 6 to 12 months.

## 2024-07-26 ENCOUNTER — Encounter: Admitting: Family Medicine
